# Patient Record
Sex: Female | Born: 1965 | Race: White | Hispanic: No | Marital: Married | Smoking: Never smoker
Health system: Southern US, Community
[De-identification: ages and names within clinical notes are randomized; demographics above are authoritative.]

## PROBLEM LIST (undated history)

## (undated) DIAGNOSIS — M25552 Pain in left hip: Secondary | ICD-10-CM

## (undated) DIAGNOSIS — R030 Elevated blood-pressure reading, without diagnosis of hypertension: Secondary | ICD-10-CM

## (undated) DIAGNOSIS — R748 Abnormal levels of other serum enzymes: Secondary | ICD-10-CM

## (undated) DIAGNOSIS — Z780 Asymptomatic menopausal state: Secondary | ICD-10-CM

## (undated) DIAGNOSIS — E7800 Pure hypercholesterolemia, unspecified: Secondary | ICD-10-CM

## (undated) DIAGNOSIS — F419 Anxiety disorder, unspecified: Secondary | ICD-10-CM

## (undated) HISTORY — DX: Abnormal levels of other serum enzymes: R74.8

## (undated) HISTORY — DX: Pain in left hip: M25.552

## (undated) HISTORY — DX: Asymptomatic menopausal state: Z78.0

## (undated) HISTORY — DX: Elevated blood-pressure reading, without diagnosis of hypertension: R03.0

## (undated) HISTORY — DX: Anxiety disorder, unspecified: F41.9

## (undated) HISTORY — DX: Pure hypercholesterolemia, unspecified: E78.00

---

## 1997-09-10 ENCOUNTER — Other Ambulatory Visit: Admission: RE | Admit: 1997-09-10 | Discharge: 1997-09-10 | Payer: Self-pay | Admitting: Obstetrics & Gynecology

## 1998-04-05 ENCOUNTER — Inpatient Hospital Stay (HOSPITAL_COMMUNITY): Admission: AD | Admit: 1998-04-05 | Discharge: 1998-04-07 | Payer: Self-pay | Admitting: Obstetrics and Gynecology

## 1998-04-05 ENCOUNTER — Inpatient Hospital Stay (HOSPITAL_COMMUNITY): Admission: AD | Admit: 1998-04-05 | Discharge: 1998-04-05 | Payer: Self-pay | Admitting: Obstetrics & Gynecology

## 1998-04-14 ENCOUNTER — Encounter (HOSPITAL_COMMUNITY): Admission: RE | Admit: 1998-04-14 | Discharge: 1998-07-13 | Payer: Self-pay | Admitting: Obstetrics & Gynecology

## 1998-05-21 ENCOUNTER — Other Ambulatory Visit: Admission: RE | Admit: 1998-05-21 | Discharge: 1998-05-21 | Payer: Self-pay | Admitting: Obstetrics & Gynecology

## 1999-07-02 ENCOUNTER — Other Ambulatory Visit: Admission: RE | Admit: 1999-07-02 | Discharge: 1999-07-02 | Payer: Self-pay | Admitting: Obstetrics & Gynecology

## 2000-10-04 ENCOUNTER — Other Ambulatory Visit: Admission: RE | Admit: 2000-10-04 | Discharge: 2000-10-04 | Payer: Self-pay | Admitting: Obstetrics & Gynecology

## 2001-10-20 ENCOUNTER — Other Ambulatory Visit: Admission: RE | Admit: 2001-10-20 | Discharge: 2001-10-20 | Payer: Self-pay | Admitting: Obstetrics and Gynecology

## 2002-01-25 HISTORY — PX: OTHER SURGICAL HISTORY: SHX169

## 2002-10-26 ENCOUNTER — Other Ambulatory Visit: Admission: RE | Admit: 2002-10-26 | Discharge: 2002-10-26 | Payer: Self-pay | Admitting: Obstetrics & Gynecology

## 2003-11-15 ENCOUNTER — Other Ambulatory Visit: Admission: RE | Admit: 2003-11-15 | Discharge: 2003-11-15 | Payer: Self-pay | Admitting: Obstetrics and Gynecology

## 2004-02-04 ENCOUNTER — Other Ambulatory Visit: Admission: RE | Admit: 2004-02-04 | Discharge: 2004-02-04 | Payer: Self-pay | Admitting: Obstetrics and Gynecology

## 2004-11-20 ENCOUNTER — Other Ambulatory Visit: Admission: RE | Admit: 2004-11-20 | Discharge: 2004-11-20 | Payer: Self-pay | Admitting: Obstetrics & Gynecology

## 2005-01-28 ENCOUNTER — Ambulatory Visit: Payer: Self-pay | Admitting: Family Medicine

## 2005-02-18 ENCOUNTER — Emergency Department (HOSPITAL_COMMUNITY): Admission: EM | Admit: 2005-02-18 | Discharge: 2005-02-18 | Payer: Self-pay | Admitting: Emergency Medicine

## 2005-03-11 ENCOUNTER — Ambulatory Visit (HOSPITAL_COMMUNITY): Admission: RE | Admit: 2005-03-11 | Discharge: 2005-03-11 | Payer: Self-pay | Admitting: Urology

## 2007-04-18 ENCOUNTER — Emergency Department (HOSPITAL_COMMUNITY): Admission: EM | Admit: 2007-04-18 | Discharge: 2007-04-18 | Payer: Self-pay | Admitting: Emergency Medicine

## 2007-04-24 ENCOUNTER — Encounter (INDEPENDENT_AMBULATORY_CARE_PROVIDER_SITE_OTHER): Payer: Self-pay | Admitting: Obstetrics & Gynecology

## 2007-04-24 ENCOUNTER — Ambulatory Visit (HOSPITAL_COMMUNITY): Admission: RE | Admit: 2007-04-24 | Discharge: 2007-04-25 | Payer: Self-pay | Admitting: Obstetrics & Gynecology

## 2010-06-09 NOTE — Op Note (Signed)
Danielle Heath, Danielle Heath              ACCOUNT NO.:  [id number]   MEDICAL RECORD NO.:  [id number]          PATIENT TYPE:  OIB   LOCATION:  9308                          FACILITY:  [facility]   PHYSICIAN:  Freddy Finner, M.D.   DATE OF BIRTH:  06-Mar-1965   DATE OF PROCEDURE:  04/24/2007  DATE OF DISCHARGE:                               OPERATIVE REPORT   PREOPERATIVE DIAGNOSES:  1. Uterine enlargement.  2. Menorrhagia.  3. Suspected uterine adenomyosis.   OPERATIVE PROCEDURES:  1. Laparoscopically-assisted vaginal hysterectomy.  2. Resection of Filshie clips from previous surgical sterilization.   ANESTHESIA:  General endotracheal.   ESTIMATED INTRAOPERATIVE BLOOD LOSS:  350 mL.   INTRAOPERATIVE COMPLICATIONS:  None.   DETAILS OF THE PRESENT ILLNESS:  These are recorded in the admission  note.   DESCRIPTION OF PROCEDURE:  The patient was admitted on the morning of  surgery.  She was brought to the operating room and placed under  adequate general endotracheal anesthesia.  She had received a bolus of  Ancef IV in the preoperative holding area, and was placed in PAS hose.  The abdomen, perineum and vagina were prepped in the usual fashion using  Betadine scrub, followed by Betadine solution.  The bladder was  evacuated with a Robison catheter.  A Hulka tenaculum was attached to  the cervix under direct visualization.  Sterile drapes were applied.  Two small incisions were made in the abdomen and one at the umbilicus.  Through an old scar, the other just above the symphysis in the midline.  An 11 mm bladed disposable trocar was entered easily into the umbilical  incision, while elevating the anterior abdominal wall manually.  Direct  inspection revealed adequate placement, and no evidence of injury on  entry.  Pneumoperitoneum was allowed to accumulate with carbon dioxide  gas.  A 5 mm trocar was placed through the lower incision, and through  it a blunt probe used for exposure and  traction during the procedure.  Scanning inspection of the upper abdomen revealed no apparent  abnormalities.  The appendix was visualized and photographed; it was  normal.  The pelvic contents were normal, except for enlargement of the  uterus; which appeared to be boggy.  There was a clip on the left  fallopian tube, that was included in the dissection.  There was a free  clip in the cul-de-sac which was grasped with the tripolar forceps and  remitted through the operating channel of the laparoscope.  Using the  Hulka tenaculum and blunt probe, adequate exposure and traction was  applied.  The Gyrus tripolar device was used through the operating  channel of the laparoscope.  To progressively develop the utero-ovarian  pedicles, the mesosalpinx, the round ligament and the upper broad  ligament on each side.  The dissection was carried down to the level  just above the uterine arteries.   Attention was turned vaginally.  A posterior weighted vaginal retractor  was placed.  The Hulka tenaculum was replaced with a Jacobs tenaculum.  Colpotomy incision was made, while attending to the mucosa posterior to  the  cervix.  The cervix was circumscribed with the scalpel to release  the mucosa.  The Bonanno posterior weighted retractor was then placed.  The LigaSure system was then used to seal and divide the uterosacral  pedicles, and then the bladder pillars on either side.  The bladder was  further advanced off the cervix.  The cardinal ligament and pedicles  were sealed and divided with LigaSure.  The anterior peritoneum was  entered.  The vessel pedicles were sealed and divided with LigaSure.  An  additional small pedicle above the vessels was required to completely  release the uterus, which was delivered through the vaginal introitus.  An arterial bleeding source at the level of the left uterosacral was  clamped with the Heaney, and suture ligated with 0 Monocryl.  The angles  of the vagina  were anchored to the uterosacrals on each side with a  mattress suture of 0 Monocryl.  The uterosacrals were plicated and the  posterior peritoneum closed with an interrupted 0 Monocryl suture.  The  cuff was closed vertically with figure-of-eight of 0 Monocryl.   A Foley catheter was placed.  After a change of gloves and repositioning  of the patient, a re-inspection laparoscopically was carried out.  Then  the Najat irrigation-aspiration system was used to clear small amounts  of clotted blood from the pelvic pedicles.  A couple of minor oozing  points were fulgurated with the tripolar forceps.   Hemostasis was confirmed under reduced intra-abdominal pressure and  irrigation.  The irrigating solution was aspirated from the abdomen.  Gas was allowed to escape.  Skin incisions were anesthetized with 0.25%  plain Marcaine.  The incisions were closed with interrupted subcuticular  sutures of 3-0 Dexon.  Steri-Strips were applied to the lower incision,  and then sterile dressing was to the upper incision.  The patient  tolerated the operative procedure well.  She was awakened and taken to  recovery in good condition.      Freddy Finner, M.D.  Electronically Signed     WRN/MEDQ  D:  04/24/2007  T:  04/24/2007  Job:  [id number]

## 2010-06-09 NOTE — H&P (Signed)
NAMEKIMIE, PIDCOCK NO.:  [id number]   MEDICAL RECORD NO.:  [id number]           PATIENT TYPE:   LOCATION:                                FACILITY:  [facility]   PHYSICIAN:  Freddy Finner, M.D.   DATE OF BIRTH:  [birth year]   DATE OF ADMISSION:  04/24/2007  DATE OF DISCHARGE:                              HISTORY & PHYSICAL   ADMISSION DIAGNOSIS:  Uterine enlargement with suspected uterine  adenomyosis, clinical symptoms of menorrhagia, previous surgical  sterilization.   HISTORY OF PRESENT ILLNESS:  The patient is a 45 year old white, married  female, gravida 1, para 1, who had tubal sterilization with Filshie  clips in 2003.  At that procedure, there were no pelvic abnormalities  noted except the overall size of the uterus had recently enlarged, but  it was symmetrical and, otherwise, normal in appearance.  The patient  has had progressively increasing menorrhagia and options of therapy were  discussed with her.  She had a sonohysterogram in the office with no  intracavitary abnormalities identified.  The myometrium was thickened to  2.1 cm on one wall and 2.4 on the other.  This finding was most  consistent, when coupled with clinical symptoms, of adenomyosis.  The  possibility of an intrauterine device, endometrial ablation, and  hysterectomy were discussed with the patient and she has elected the  later as the most appropriate for her situation.  She is admitted at  this time for laparoscopically assisted vaginal hysterectomy.   REVIEW OF SYMPTOMS:  Her current review of systems is remarkable for  back pain secondary to musculoskeletal strain for which she has been on  prednisone which will be completed approximately two days prior to  surgery.  She also is taking Flexeril at bedtime for this as well as  hydrocodone with acetaminophen.   PAST MEDICAL HISTORY:  The patient has no known significant medical  illnesses.  She does have situational anxiety and she is  currently  taking Lexapro 10 mg a day and Wellbutrin extended release XL 300 mg a  day.   ALLERGIES:  No known drug allergies.   PAST SURGICAL HISTORY:  Tubal sterilization noted above.  She gave birth  to one child vaginally.   FAMILY HISTORY:  Noncontributory.   PHYSICAL EXAMINATION:  HEENT:  Grossly within normal limits.  VITAL SIGNS:  Blood pressure in the office 120/70, weight 184, height 5  feet 6 1/4 inches.  NECK:  The thyroid gland is not palpably enlarged.  CHEST:  Clear to auscultation.  HEART:  Normal sinus rhythm without murmurs, gallops, and rubs.  ABDOMEN:  Soft, nontender, there is no appreciable organomegaly or  palpable masses.  BREASTS:  Recent breast exam in the office was normal with no palpable  masses, no nipple discharge, no skin change, or retraction.  PELVIC:  External genitalia, vagina, and cervix were normal to  inspection.  Bimanual reveals the uterus to be slightly enlarged and  mildly tender.  There are no palpable adnexal masses.  RECTAL:  Normal.  Rectovaginal exam confirms.  EXTREMITIES:  No cyanosis, clubbing, and  edema.   ASSESSMENT:  Clinical symptoms of menorrhagia, previous surgical  sterilization, myometrial thickening on sonohysterogram most consistent  with adenomyosis.   PLAN:  Laparoscopically assisted vaginal hysterectomy.  Bilateral  salpingo-oophorectomy will be performed only in the presence of  significant abnormality or obvious pathology of the ovaries.      Freddy Finner, M.D.  Electronically Signed     WRN/MEDQ  D:  04/20/2007  T:  04/20/2007  Job:  [id number]

## 2010-06-12 NOTE — Discharge Summary (Signed)
Danielle Heath, Danielle Heath              ACCOUNT NO.:  [id number]   MEDICAL RECORD NO.:  [id number]          PATIENT TYPE:  OIB   LOCATION:  9308                          FACILITY:  [facility]   PHYSICIAN:  Freddy Finner, M.D.   DATE OF BIRTH:  [birth year]   DATE OF ADMISSION:  04/24/2007  DATE OF DISCHARGE:  04/25/2007                               DISCHARGE SUMMARY   DISCHARGE DIAGNOSIS:  Uterine adenomyosis.   CLINICAL SYMPTOMS:  Menorrhagia.   OPERATIVE PROCEDURE:  Laparoscopic assisted vaginal hysterectomy,  removal of Filshie clips from previous surgical sterilization.   INTRAOPERATIVE AND POSTOPERATIVE COMPLICATIONS:  None.   DISPOSITION:  The patient was in satisfactory and improved condition at  the time of her discharge on the first postoperative day.  She was using  all of her previous medications from home.  She was given Percocet 5/325  to be taken as needed for pain.  She was given Zofran oral dissolving  tablets 4 mg to be used as needed for nausea and vomiting.  She was  instructed on regular diet.  She is to ambulate.  She is to avoid heavy  lifting or vaginal entry.  She is to report fever or heavy bleeding.  She is to return to the office in approximately 10-14 days for her first  postoperative visit.   LABORATORY DATA:  During this admission includes histologic confirmation  of a diagnosis of adenomyosis.  Admission CBC, prothrombin, PTT, they  were all normal.  Chem segs on admission was normal except for potassium  slightly low at 3.3, hemoglobin postoperatively was 11.8.   HOSPITAL COURSE:  The patient was admitted on the morning of surgery.  She was treated perioperatively with IV antibiotic with PAS compression  hose.  The above described procedure was accomplished without difficulty  or intraoperative complications.  Her postoperative course was  uncomplicated.  She remained afebrile.  By the first postoperative day  she was ambulating without difficulty or  assistance.  She was tolerating  a regular diet.  She was having adequate bowel and bladder function.  She was discharged home with disposition as noted above.      Freddy Finner, M.D.  Electronically Signed     WRN/MEDQ  D:  05/29/2007  T:  05/30/2007  Job:  [id number]

## 2010-10-19 LAB — CBC
HCT: 33.9 — ABNORMAL LOW
HCT: 39
Hemoglobin: 11.8 — ABNORMAL LOW
MCHC: 34.9
MCV: 92
MCV: 92.3
Platelets: 314
Platelets: 318
RDW: 13.1
WBC: 7.2

## 2010-10-19 LAB — PROTIME-INR: Prothrombin Time: 12.2

## 2010-10-19 LAB — APTT: aPTT: 27

## 2010-10-19 LAB — BASIC METABOLIC PANEL
Chloride: 102
GFR calc Af Amer: >60

## 2015-11-10 LAB — HM PAP SMEAR: HM PAP: NEGATIVE

## 2015-11-14 LAB — HM MAMMOGRAPHY

## 2016-11-10 ENCOUNTER — Ambulatory Visit (INDEPENDENT_AMBULATORY_CARE_PROVIDER_SITE_OTHER): Payer: 59

## 2016-11-10 ENCOUNTER — Ambulatory Visit (INDEPENDENT_AMBULATORY_CARE_PROVIDER_SITE_OTHER): Payer: 59 | Admitting: Osteopathic Medicine

## 2016-11-10 ENCOUNTER — Encounter: Payer: Self-pay | Admitting: Osteopathic Medicine

## 2016-11-10 ENCOUNTER — Encounter (INDEPENDENT_AMBULATORY_CARE_PROVIDER_SITE_OTHER): Payer: Self-pay

## 2016-11-10 VITALS — BP 149/89 | HR 72 | Ht 66.0 in | Wt 176.0 lb

## 2016-11-10 DIAGNOSIS — N951 Menopausal and female climacteric states: Secondary | ICD-10-CM | POA: Diagnosis not present

## 2016-11-10 DIAGNOSIS — M25552 Pain in left hip: Secondary | ICD-10-CM | POA: Diagnosis not present

## 2016-11-10 DIAGNOSIS — M25551 Pain in right hip: Secondary | ICD-10-CM

## 2016-11-10 DIAGNOSIS — R232 Flushing: Secondary | ICD-10-CM

## 2016-11-10 DIAGNOSIS — R5383 Other fatigue: Secondary | ICD-10-CM

## 2016-11-10 DIAGNOSIS — R454 Irritability and anger: Secondary | ICD-10-CM | POA: Diagnosis not present

## 2016-11-10 DIAGNOSIS — F419 Anxiety disorder, unspecified: Secondary | ICD-10-CM | POA: Diagnosis not present

## 2016-11-10 DIAGNOSIS — Z8619 Personal history of other infectious and parasitic diseases: Secondary | ICD-10-CM

## 2016-11-10 MED ORDER — VENLAFAXINE HCL ER 37.5 MG PO CP24: 37.5000 mg | ORAL_CAPSULE | Freq: Every day | ORAL | 1 refills | Status: DC

## 2016-11-10 MED ORDER — MELOXICAM 15 MG PO TABS: 15.0000 mg | ORAL_TABLET | Freq: Every day | ORAL | 0 refills | Status: DC

## 2016-11-10 NOTE — Progress Notes (Signed)
HPI: Danielle Heath is a 51 y.o. female  who [facility], 11/10/16,  for chief complaint of:  Chief Complaint  Patient presents with  . Establish Care    CHECK UP AND HORMON LEVELS     Leg pain: fall about a year ago, pulled to ground while walking dog, L knee injury. Kne epain better, current pain in R hip down into the leg, fells like tooth-ache type pain. Pressing on hit This giongon for a year and getting worse. No particular motion makes worse  Hot flashes, moody, irritable. Hysterectomy 2004, endometriosis. Left the ovaries.   History of chronic shingles, she has been on suppressive therapy with valacyclovir 500 mg daily for some time.  History of anxiety: Has been on Wellbutrin 300 mg daily and reports occasional use of Xanax a few times per week.  Vit D 2000 daily B12 4000 daily    Past medical, surgical, social and family history reviewed: There are no active problems to display for this patient.  No past surgical history on file. Social History  Substance Use Topics  . Smoking status: Never Smoker  . Smokeless tobacco: Never Used  . Alcohol use Not on file   Family History  Problem Relation Age of Onset  . Alcohol abuse Mother   . Depression Mother   . Hyperlipidemia Mother   . Hypertension Mother   . Alcohol abuse Father   . Cancer Father   . Hyperlipidemia Father   . Hypertension Father   . Diabetes Maternal Grandmother   . Alcohol abuse Maternal Grandfather      Current medication list and allergy/intolerance information reviewed:   Current Outpatient Prescriptions  Medication Sig Dispense Refill  . ALPRAZolam (XANAX) 0.5 MG tablet TAKE 1 TABLET 3 TIMES A DAY AS NEEDED  0  . buPROPion (WELLBUTRIN XL) 300 MG 24 hr tablet Take 300 mg by mouth every morning.  1  . cholecalciferol (VITAMIN D) 1000 units tablet Take 1,000 Units by mouth daily.    . valACYclovir (VALTREX) 1000 MG tablet Take 500 mg by  mouth daily.  6   No current facility-administered medications for this visit.    No Known Allergies    Review of Systems:  Constitutional:  No  fever, no chills, No recent illness, No unintentional weight changes. No significant fatigue.   HEENT: No  headache, no vision change, no hearing change, No sore throat, No  sinus pressure  Cardiac: No  chest pain, No  pressure, No palpitations, No  Orthopnea  Respiratory:  No  shortness of breath. No  Cough  Gastrointestinal: No  abdominal pain, No  nausea, No  vomiting,  No  blood in stool, No  diarrhea, No  constipation   Musculoskeletal: No new myalgia/arthralgia  Genitourinary: No  incontinence, No  abnormal genital bleeding, No abnormal genital discharge  Skin: No  Rash,  Hem/Onc: No  easy bruising/bleeding  Endocrine: No cold intolerance,  No heat intolerance.   Neurologic: No  weakness, No  dizziness,  Psychiatric: No  concerns with depression, +concerns with anxiety, +sleep problems, No mood problems  Exam:  BP (!) 149/89   Pulse 72   Ht 5\' 6"  (1.676 m)   Wt 176 lb (79.8 kg)   BMI 28.41 kg/m   Constitutional: VS see above. General Appearance: alert, well-developed, well-nourished, NAD  Eyes: Normal lids and conjunctive, non-icteric sclera  Ears, Nose, Mouth, Throat: MMM, Normal external inspection ears/nares/mouth/lips/gums.   Neck:  No masses, trachea midline. No thyroid enlargement.   Respiratory: Normal respiratory effort. no wheeze, no rhonchi, no rales  Cardiovascular: S1/S2 normal, no murmur, no rub/gallop auscultated. RRR. No lower extremity edema. Pedal pulse II/IV bilaterally DP and PT. No carotid bruit or JVD. No abdominal aortic bruit.  Gastrointestinal: Nontender, no masses. No hepatomegaly, no splenomegaly.   Musculoskeletal: Gait normal. No clubbing/cyanosis of digits.   Neurological: Normal balance/coordination. No tremor.   Skin: warm, dry, intact.   Psychiatric: Normal judgment/insight.  Normal mood and affect. Oriented x3.     ASSESSMENT/PLAN:   Fatigue, unspecified type - Plan: CBC, COMPLETE METABOLIC PANEL WITH GFR, Vitamin B12, VITAMIN D 25 Hydroxy (Vit-D Deficiency, Fractures), Lipid panel, TSH  Perimenopausal - Plan: VITAMIN D 25 Hydroxy (Vit-D Deficiency, Fractures), FSH/LH  Hot flashes - Plan: TSH, FSH/LH  Anxiety  Irritable  History of shingles  Left hip pain - Plan: meloxicam (MOBIC) 15 MG tablet, DG HIP UNILAT WITH PELVIS 2-3 VIEWS RIGHT, CANCELED: DG HIP UNILAT W OR W/O PELVIS 2-3 VIEWS LEFT    Patient Instructions  Plan:  Labs - checking hormone and vitamin levels as well as routine other screening  Hip - home exercises and anti-inflammatories. If no better, in 2-4 weeks, schedule follow up with Dr Denyse Amass or Dr T  Medications - antiinflammatory (mobic) and anti-depressant/anxiety/hot flash medication (Effexor)    Results for orders placed or performed in visit on 11/10/16 (from the past 72 hour(s))  CBC     Status: None   Collection Time: 11/10/16  4:20 PM  Result Value Ref Range   WBC 7.5 3.8 - 10.8 Thousand/uL   RBC 4.57 3.80 - 5.10 Million/uL   Hemoglobin 14.6 11.7 - 15.5 g/dL   HCT 78.2 95.6 - 21.3 %   MCV 89.9 80.0 - 100.0 fL   MCH 31.9 27.0 - 33.0 pg   MCHC 35.5 32.0 - 36.0 g/dL   RDW 08.6 57.8 - 46.9 %   Platelets 286 140 - 400 Thousand/uL   MPV 11.8 7.5 - 12.5 fL  COMPLETE METABOLIC PANEL WITH GFR     Status: Abnormal   Collection Time: 11/10/16  4:20 PM  Result Value Ref Range   Glucose, Bld 84 65 - 139 mg/dL    Comment: .        Non-fasting reference interval .    BUN 23 7 - 25 mg/dL   Creat 6.29 5.28 - 4.13 mg/dL   GFR, Est Non African American 97 > OR = 60 mL/min/1.30m2   GFR, Est African American 112 > OR = 60 mL/min/1.23m2   BUN/Creatinine Ratio NOT APPLICABLE 6 - 22 (calc)   Sodium 139 135 - 146 mmol/L   Potassium 4.4 3.5 - 5.3 mmol/L   Chloride 102 98 - 110 mmol/L   CO2 29 20 - 32 mmol/L   Calcium 10.1 8.6 -  10.2 mg/dL   Total Protein 6.9 6.1 - 8.1 g/dL   Albumin 4.7 3.6 - 5.1 g/dL   Globulin 2.2 1.9 - 3.7 g/dL (calc)   AG Ratio 2.1 1.0 - 2.5 (calc)   Total Bilirubin 0.5 0.2 - 1.2 mg/dL   Alkaline phosphatase (APISO) 101 33 - 115 U/L   AST 27 10 - 35 U/L   ALT 31 (H) 6 - 29 U/L  Vitamin B12     Status: Abnormal   Collection Time: 11/10/16  4:20 PM  Result Value Ref Range   Vitamin B-12 >2,000 (H) 200 - 1,100 pg/mL  VITAMIN D  25 Hydroxy (Vit-D Deficiency, Fractures)     Status: None   Collection Time: 11/10/16  4:20 PM  Result Value Ref Range   Vit D, 25-Hydroxy 73 30 - 100 ng/mL    Comment: Vitamin D Status         25-OH Vitamin D: . Deficiency:                    <20 ng/mL Insufficiency:             20 - 29 ng/mL Optimal:                 > or = 30 ng/mL . For 25-OH Vitamin D testing on patients on  D2-supplementation and patients for whom quantitation  of D2 and D3 fractions is required, the QuestAssureD(TM) 25-OH VIT D, (D2,D3), LC/MS/MS is recommended: order  code 16109 (patients >18yrs). . For more information on this test, go to: http://education.questdiagnostics.com/faq/FAQ163 (This link is being provided for  informational/educational purposes only.)   Lipid panel     Status: Abnormal   Collection Time: 11/10/16  4:20 PM  Result Value Ref Range   Cholesterol 253 (H) <200 mg/dL   HDL 58 >60 mg/dL   Triglycerides 454 (H) <150 mg/dL   LDL Cholesterol (Calc) 162 (H) mg/dL (calc)    Comment: Reference range: <100 . Desirable range <100 mg/dL for primary prevention;   <70 mg/dL for patients with CHD or diabetic patients  with > or = 2 CHD risk factors. Marland Kitchen LDL-C is now calculated using the Martin-Hopkins  calculation, which is a validated novel method providing  better accuracy than the Friedewald equation in the  estimation of LDL-C.  Horald Pollen et al. Lenox Ahr. 0981;191(47): 2061-2068  (http://education.[facility].com/faq/FAQ164)    Total CHOL/HDL Ratio 4.4 <5.0  (calc)   Non-HDL Cholesterol (Calc) 195 (H) <130 mg/dL (calc)    Comment: For patients with diabetes plus 1 major ASCVD risk  factor, treating to a non-HDL-C goal of <100 mg/dL  (LDL-C of <82 mg/dL) is considered a therapeutic  option.   TSH     Status: None   Collection Time: 11/10/16  4:20 PM  Result Value Ref Range   TSH 0.77 mIU/L    Comment:           Reference Range .           > or = 20 Years  0.40-4.50 .                Pregnancy Ranges           First trimester    0.26-2.66           Second trimester   0.55-2.73           Third trimester    0.43-2.91   FSH/LH     Status: None   Collection Time: 11/10/16  4:20 PM  Result Value Ref Range   FSH 93.1 mIU/mL    Comment:                     Reference Range .              Follicular Phase       2.5-10.2              Mid-cycle Peak         3.1-17.7              Luteal Phase  1.5- 9.1              Postmenopausal       23.0-116.3              .    LH 35.3 mIU/mL    Comment:     Reference Range Follicular Phase  1.9-12.5 Mid-Cycle Peak    8.7-76.3 Luteal Phase      0.5-16.9 Postmenopausal    10.0-54.7      Visit summary with medication list and pertinent instructions was printed for patient to review. All questions at time of visit were answered - patient instructed to contact office with any additional concerns. ER/RTC precautions were reviewed with the patient. Follow-up plan: Return in about 4 weeks (around 12/08/2016) for follow up on new meds, review labs, recheck BP .

## 2016-11-10 NOTE — Patient Instructions (Signed)
Plan:  Labs - checking hormone and vitamin levels as well as routine other screening  Hip - home exercises and anti-inflammatories. If no better, in 2-4 weeks, schedule follow up with Dr Denyse Amassorey or Dr T  Medications - antiinflammatory (mobic) and anti-depressant/anxiety/hot flash medication (Effexor)

## 2016-11-11 LAB — LIPID PANEL
CHOL/HDL RATIO: 4.4 (calc) (ref <5.0)
Cholesterol: 253 mg/dL — ABNORMAL HIGH (ref <200)
HDL: 58 mg/dL (ref >50)
LDL Cholesterol (Calc): 162 mg/dL (calc) — ABNORMAL HIGH
NON-HDL CHOLESTEROL (CALC): 195 mg/dL — AB (ref <130)
TRIGLYCERIDES: 173 mg/dL — AB (ref <150)

## 2016-11-11 LAB — CBC
HEMATOCRIT: 41.1 % (ref 35.0–45.0)
Hemoglobin: 14.6 g/dL (ref 11.7–15.5)
MCH: 31.9 pg (ref 27.0–33.0)
MCHC: 35.5 g/dL (ref 32.0–36.0)
MCV: 89.9 fL (ref 80.0–100.0)
MPV: 11.8 fL (ref 7.5–12.5)
PLATELETS: 286 10*3/uL (ref 140–400)
RBC: 4.57 10*6/uL (ref 3.80–5.10)
RDW: 12.2 % (ref 11.0–15.0)
WBC: 7.5 10*3/uL (ref 3.8–10.8)

## 2016-11-11 LAB — COMPLETE METABOLIC PANEL WITH GFR
AG Ratio: 2.1 (calc) (ref 1.0–2.5)
ALT: 31 U/L — AB (ref 6–29)
AST: 27 U/L (ref 10–35)
Albumin: 4.7 g/dL (ref 3.6–5.1)
Alkaline phosphatase (APISO): 101 U/L (ref 33–115)
BUN: 23 mg/dL (ref 7–25)
CALCIUM: 10.1 mg/dL (ref 8.6–10.2)
CO2: 29 mmol/L (ref 20–32)
CREATININE: 0.73 mg/dL (ref 0.50–1.10)
Chloride: 102 mmol/L (ref 98–110)
GFR, EST NON AFRICAN AMERICAN: 97 mL/min/{1.73_m2} (ref >=60)
GFR, Est African American: 112 mL/min/{1.73_m2} (ref >=60)
GLUCOSE: 84 mg/dL (ref 65–139)
Globulin: 2.2 g/dL (calc) (ref 1.9–3.7)
Potassium: 4.4 mmol/L (ref 3.5–5.3)
Sodium: 139 mmol/L (ref 135–146)
Total Bilirubin: 0.5 mg/dL (ref 0.2–1.2)
Total Protein: 6.9 g/dL (ref 6.1–8.1)

## 2016-11-11 LAB — FSH/LH
FSH: 93.1 m[IU]/mL
LH: 35.3 m[IU]/mL

## 2016-11-11 LAB — VITAMIN B12: Vitamin B-12: >2000 pg/mL — ABNORMAL HIGH (ref 200–1100)

## 2016-11-11 LAB — VITAMIN D 25 HYDROXY (VIT D DEFICIENCY, FRACTURES): VIT D 25 HYDROXY: 73 ng/mL (ref 30–100)

## 2016-11-11 LAB — TSH: TSH: 0.77 mIU/L

## 2016-12-06 ENCOUNTER — Other Ambulatory Visit: Payer: Self-pay | Admitting: Osteopathic Medicine

## 2016-12-06 DIAGNOSIS — M25552 Pain in left hip: Secondary | ICD-10-CM

## 2016-12-08 ENCOUNTER — Encounter: Payer: Self-pay | Admitting: Osteopathic Medicine

## 2016-12-08 ENCOUNTER — Ambulatory Visit (INDEPENDENT_AMBULATORY_CARE_PROVIDER_SITE_OTHER): Payer: 59 | Admitting: Osteopathic Medicine

## 2016-12-08 VITALS — BP 143/90 | HR 95 | Ht 66.0 in | Wt 176.0 lb

## 2016-12-08 DIAGNOSIS — E7800 Pure hypercholesterolemia, unspecified: Secondary | ICD-10-CM | POA: Diagnosis not present

## 2016-12-08 DIAGNOSIS — F419 Anxiety disorder, unspecified: Secondary | ICD-10-CM | POA: Diagnosis not present

## 2016-12-08 DIAGNOSIS — M25552 Pain in left hip: Secondary | ICD-10-CM

## 2016-12-08 DIAGNOSIS — Z780 Asymptomatic menopausal state: Secondary | ICD-10-CM

## 2016-12-08 DIAGNOSIS — R748 Abnormal levels of other serum enzymes: Secondary | ICD-10-CM

## 2016-12-08 DIAGNOSIS — R030 Elevated blood-pressure reading, without diagnosis of hypertension: Secondary | ICD-10-CM

## 2016-12-08 MED ORDER — MELOXICAM 15 MG PO TABS: 7.5000 mg | ORAL_TABLET | Freq: Every day | ORAL | 1 refills | Status: DC

## 2016-12-08 NOTE — Progress Notes (Signed)
HPI: Danielle Heath is a 51 y.o. female  who presents to [facility] [place] today, 12/08/16,  for chief complaint of:  Chief Complaint  Patient presents with  . Follow-up    EFFEXOR DIDNT MAKE HER FEEL RIGHT SO SHE QUIT TAKING IT    Elevated blood pressure: Patient has been reluctant to start medications for this.  Leg pain: fall about a year ago, pulled to ground while walking dog, L knee injury and right hip pain have improved dramatically after home exercises and back.  Hot flashes, moody, irritable. Hysterectomy 2004, endometriosis. Left the ovaries. Recent lab results consistent with postmenopausal state. We tried Effexor for these symptoms, she says this medicine made her quite tired so she stopped taking it.  History of chronic shingles, she has been on suppressive therapy with valacyclovir 500 mg daily for some time.  History of anxiety: Has been on Wellbutrin 300 mg daily and reports occasional use of Xanax a few times per week.  B12 excess: Patient advised of lab results.    Past medical, surgical, social and family history reviewed: Patient Active Problem List   Diagnosis Date Noted  . Elevated vitamin B12 level 12/09/2016  . Left hip pain 12/09/2016  . Elevated blood pressure reading 12/09/2016  . High cholesterol 12/09/2016  . Anxiety 12/09/2016   Past Surgical History:  Procedure Laterality Date  . UTERUS REMOVED  2004   Social History   Tobacco Use  . Smoking status: Never Smoker  . Smokeless tobacco: Never Used  Substance Use Topics  . Alcohol use: Not on file   Family History  Problem Relation Age of Onset  . Alcohol abuse Mother   . Depression Mother   . Hyperlipidemia Mother   . Hypertension Mother   . Alcohol abuse Father   . Cancer Father   . Hyperlipidemia Father   . Hypertension Father   . Diabetes Maternal Grandmother   . Alcohol abuse Maternal Grandfather      Current medication list and  allergy/intolerance information reviewed:   Current Outpatient Medications  Medication Sig Dispense Refill  . ALPRAZolam (XANAX) 0.5 MG tablet TAKE 1 TABLET 3 TIMES A DAY AS NEEDED  0  . buPROPion (WELLBUTRIN XL) 300 MG 24 hr tablet Take 300 mg by mouth every morning.  1  . cholecalciferol (VITAMIN D) 1000 units tablet Take 1,000 Units by mouth daily.    . meloxicam (MOBIC) 15 MG tablet Take 0.5-1 tablets (7.5-15 mg total) daily by mouth. As needed for aches/pains 90 tablet 1  . valACYclovir (VALTREX) 1000 MG tablet Take 500 mg by mouth daily.  6   No current facility-administered medications for this visit.    No Known Allergies    Review of Systems:  Constitutional:  No  fever, no chills, No recent illness.   HEENT: No  headache, no vision change  Cardiac: No  chest pain  Respiratory:  No  shortness of breath.  Musculoskeletal: No new myalgia/arthralgia  Psychiatric: No  concerns with depression, +concerns with anxiety, +sleep problems, No mood problems  Exam:  BP (!) 143/90   Pulse 95   Ht 5\' 6"  (1.676 m)   Wt 176 lb (79.8 kg)   BMI 28.41 kg/m   Constitutional: VS see above. General Appearance: alert, well-developed, well-nourished, NAD  Eyes: Normal lids and conjunctive, non-icteric sclera  Ears, Nose, Mouth, Throat: MMM, Normal external inspection ears/nares/mouth/lips/gums.   Neck: No masses, trachea midline. No thyroid enlargement.   Respiratory:  Normal respiratory effort. no wheeze, no rhonchi, no rales  Cardiovascular: S1/S2 normal, no murmur, no rub/gallop auscultated. RRR. No lower extremity edema. Pedal pulse II/IV bilaterally DP and PT. No carotid bruit or JVD. No abdominal aortic bruit.  Gastrointestinal: Nontender, no masses. No hepatomegaly, no splenomegaly.   Musculoskeletal: Gait normal. No clubbing/cyanosis of digits.   Neurological: Normal balance/coordination. No tremor.   Skin: warm, dry, intact.   Psychiatric: Normal judgment/insight.  Normal mood and affect. Oriented x3.   Recent Results (from the past 2160 hour(s))  CBC     Status: None   Collection Time: 11/10/16  4:20 PM  Result Value Ref Range   WBC 7.5 3.8 - 10.8 Thousand/uL   RBC 4.57 3.80 - 5.10 Million/uL   Hemoglobin 14.6 11.7 - 15.5 g/dL   HCT 45.441.1 09.835.0 - 11.945.0 %   MCV 89.9 80.0 - 100.0 fL   MCH 31.9 27.0 - 33.0 pg   MCHC 35.5 32.0 - 36.0 g/dL   RDW 14.712.2 82.911.0 - 56.215.0 %   Platelets 286 140 - 400 Thousand/uL   MPV 11.8 7.5 - 12.5 fL  COMPLETE METABOLIC PANEL WITH GFR     Status: Abnormal   Collection Time: 11/10/16  4:20 PM  Result Value Ref Range   Glucose, Bld 84 65 - 139 mg/dL    Comment: .        Non-fasting reference interval .    BUN 23 7 - 25 mg/dL   Creat 1.300.73 8.650.50 - 7.841.10 mg/dL   GFR, Est Non African American 97 > OR = 60 mL/min/1.6173m2   GFR, Est African American 112 > OR = 60 mL/min/1.7473m2   BUN/Creatinine Ratio NOT APPLICABLE 6 - 22 (calc)   Sodium 139 135 - 146 mmol/L   Potassium 4.4 3.5 - 5.3 mmol/L   Chloride 102 98 - 110 mmol/L   CO2 29 20 - 32 mmol/L   Calcium 10.1 8.6 - 10.2 mg/dL   Total Protein 6.9 6.1 - 8.1 g/dL   Albumin 4.7 3.6 - 5.1 g/dL   Globulin 2.2 1.9 - 3.7 g/dL (calc)   AG Ratio 2.1 1.0 - 2.5 (calc)   Total Bilirubin 0.5 0.2 - 1.2 mg/dL   Alkaline phosphatase (APISO) 101 33 - 115 U/L   AST 27 10 - 35 U/L   ALT 31 (H) 6 - 29 U/L  Vitamin B12     Status: Abnormal   Collection Time: 11/10/16  4:20 PM  Result Value Ref Range   Vitamin B-12 >2,000 (H) 200 - 1,100 pg/mL  VITAMIN D 25 Hydroxy (Vit-D Deficiency, Fractures)     Status: None   Collection Time: 11/10/16  4:20 PM  Result Value Ref Range   Vit D, 25-Hydroxy 73 30 - 100 ng/mL    Comment: Vitamin D Status         25-OH Vitamin D: . Deficiency:                    <20 ng/mL Insufficiency:             20 - 29 ng/mL Optimal:                 > or = 30 ng/mL . For 25-OH Vitamin D testing on patients on  D2-supplementation and patients for whom quantitation  of  D2 and D3 fractions is required, the QuestAssureD(TM) 25-OH VIT D, (D2,D3), LC/MS/MS is recommended: order  code [id number] (patients >4741yrs). . For more information on this  test, go to: http://education.questdiagnostics.com/faq/FAQ163 (This link is being provided for  informational/educational purposes only.)   Lipid panel     Status: Abnormal   Collection Time: 11/10/16  4:20 PM  Result Value Ref Range   Cholesterol 253 (H) <200 mg/dL   HDL 58 >82 mg/dL   Triglycerides 956 (H) <150 mg/dL   LDL Cholesterol (Calc) 162 (H) mg/dL (calc)    Comment: Reference range: <100 . Desirable range <100 mg/dL for primary prevention;   <70 mg/dL for patients with CHD or diabetic patients  with > or = 2 CHD risk factors. Marland Kitchen LDL-C is now calculated using the Martin-Hopkins  calculation, which is a validated novel method providing  better accuracy than the Friedewald equation in the  estimation of LDL-C.  Horald Pollen et al. Lenox Ahr. 2130;865(78): 2061-2068  (http://education.[facility].com/faq/FAQ164)    Total CHOL/HDL Ratio 4.4 <5.0 (calc)   Non-HDL Cholesterol (Calc) 195 (H) <130 mg/dL (calc)    Comment: For patients with diabetes plus 1 major ASCVD risk  factor, treating to a non-HDL-C goal of <100 mg/dL  (LDL-C of <46 mg/dL) is considered a therapeutic  option.   TSH     Status: None   Collection Time: 11/10/16  4:20 PM  Result Value Ref Range   TSH 0.77 mIU/L    Comment:           Reference Range .           > or = 20 Years  0.40-4.50 .                Pregnancy Ranges           First trimester    0.26-2.66           Second trimester   0.55-2.73           Third trimester    0.43-2.91   FSH/LH     Status: None   Collection Time: 11/10/16  4:20 PM  Result Value Ref Range   FSH 93.1 mIU/mL    Comment:                     Reference Range .              Follicular Phase       2.5-10.2              Mid-cycle Peak         3.1-17.7              Luteal Phase           1.5- 9.1               Postmenopausal       23.0-116.3              .    LH 35.3 mIU/mL    Comment:     Reference Range Follicular Phase  1.9-12.5 Mid-Cycle Peak    8.7-76.3 Luteal Phase      0.5-16.9 Postmenopausal    10.0-54.7      ASSESSMENT/PLAN:   Elevated blood pressure reading - Discussed risks versus benefits of untreated hypertension/medications. Patient opts to follow-up on blood pressure, we'll consider purchasing own cuff  High cholesterol - Plan: Lipid panel  Elevated vitamin B12 level - Plan: Vitamin B12  Left hip pain - Plan: meloxicam (MOBIC) 15 MG tablet  Anxiety  Postmenopausal    Patient Instructions   Plan to return for nurse visit to verify  home blood pressure cuff. In the meantime, be keeping a record of your blood pressures at home and bring this with you to the visit with the nurse.   If your cuff is measuring within 5-10 points of ours AND your home numbers are less than 140/90 (ideally less than 130/80) then nothing else to do.   If your home blood pressure cuff is inaccurate or is accurate but measuring above goal, we will need to talk about medications.     Total time spent: 25 minutes, greater than 50% of the visit was to face counseling and coordinating care for the above diagnoses.   Visit summary with medication list and pertinent instructions was printed for patient to review. All questions at time of visit were answered - patient instructed to contact office with any additional concerns. ER/RTC precautions were reviewed with the patient. Follow-up plan: Return in about 2 weeks (around 12/22/2016) for nurse visit BP check, followup with Dr A depending on that . HPI:

## 2016-12-08 NOTE — Patient Instructions (Signed)
   Plan to return for nurse visit to verify home blood pressure cuff. In the meantime, be keeping a record of your blood pressures at home and bring this with you to the visit with the nurse.   If your cuff is measuring within 5-10 points of ours AND your home numbers are less than 140/90 (ideally less than 130/80) then nothing else to do.   If your home blood pressure cuff is inaccurate or is accurate but measuring above goal, we will need to talk about medications.

## 2016-12-09 ENCOUNTER — Encounter: Payer: Self-pay | Admitting: Osteopathic Medicine

## 2016-12-09 DIAGNOSIS — E7800 Pure hypercholesterolemia, unspecified: Secondary | ICD-10-CM

## 2016-12-09 DIAGNOSIS — R748 Abnormal levels of other serum enzymes: Secondary | ICD-10-CM

## 2016-12-09 DIAGNOSIS — F419 Anxiety disorder, unspecified: Secondary | ICD-10-CM

## 2016-12-09 DIAGNOSIS — R030 Elevated blood-pressure reading, without diagnosis of hypertension: Secondary | ICD-10-CM

## 2016-12-09 DIAGNOSIS — R7989 Other specified abnormal findings of blood chemistry: Secondary | ICD-10-CM | POA: Insufficient documentation

## 2016-12-09 DIAGNOSIS — M25552 Pain in left hip: Secondary | ICD-10-CM

## 2016-12-09 DIAGNOSIS — Z780 Asymptomatic menopausal state: Secondary | ICD-10-CM

## 2016-12-09 HISTORY — DX: Pain in left hip: M25.552

## 2016-12-09 HISTORY — DX: Asymptomatic menopausal state: Z78.0

## 2016-12-09 HISTORY — DX: Abnormal levels of other serum enzymes: R74.8

## 2016-12-09 HISTORY — DX: Elevated blood-pressure reading, without diagnosis of hypertension: R03.0

## 2016-12-09 HISTORY — DX: Pure hypercholesterolemia, unspecified: E78.00

## 2016-12-09 HISTORY — DX: Anxiety disorder, unspecified: F41.9

## 2016-12-22 ENCOUNTER — Ambulatory Visit (INDEPENDENT_AMBULATORY_CARE_PROVIDER_SITE_OTHER): Payer: 59 | Admitting: Osteopathic Medicine

## 2016-12-22 VITALS — BP 135/83 | HR 75

## 2016-12-22 DIAGNOSIS — R030 Elevated blood-pressure reading, without diagnosis of hypertension: Secondary | ICD-10-CM

## 2016-12-22 NOTE — Progress Notes (Signed)
Her blood pressure monitor is fairly accurate. Home numbers are borderline, not consistently less than 130/80, occasionally 140s over 90s. Patient would like to avoid medications if possible, she is advised to wait 5 minutes before checking her home blood pressures. Based on current guidelines, she would be considered hypertensive but in shared decision making we do not necessarily have to start blood pressure medications if she does not want to as long as she is aware of risks versus benefits. Will address further at follow-up depending on what her home numbers are looking like.

## 2016-12-22 NOTE — Progress Notes (Signed)
Pt came into clinic today for BP check. She did bring her home monitor. Pt reports she has been taking her BP at home, and brought a list of readings. They range from 135/83-147/93. Most readings are around 138/85 (will scan list into chart). Home cuff read 153/82 (76) and 135/89 (73) today in office. Went over readings with PCP. Per PCP, Pt to continue monitoring at home. If readings dont start to come down, they will have to discuss potential BP Rx's. Pt verbalized understanding.

## 2017-02-04 ENCOUNTER — Encounter: Payer: Self-pay | Admitting: Osteopathic Medicine

## 2017-03-22 ENCOUNTER — Other Ambulatory Visit: Payer: Self-pay | Admitting: Osteopathic Medicine

## 2017-03-22 ENCOUNTER — Telehealth: Payer: Self-pay

## 2017-03-22 NOTE — Telephone Encounter (Signed)
Pt left a vm msg requesting medication refills for xanax & wellbutrin xl to be sent to [facility] on file. Both rxs were written by historical provider.

## 2017-03-23 ENCOUNTER — Telehealth: Payer: Self-pay

## 2017-03-23 MED ORDER — BUPROPION HCL ER (XL) 300 MG PO TB24: 300.0000 mg | ORAL_TABLET | Freq: Every morning | ORAL | 3 refills | Status: DC

## 2017-03-23 MED ORDER — ALPRAZOLAM 0.5 MG PO TABS: 0.5000 mg | ORAL_TABLET | Freq: Two times a day (BID) | ORAL | 0 refills | Status: DC | PRN

## 2017-03-23 NOTE — Telephone Encounter (Signed)
Sent!

## 2017-03-23 NOTE — Telephone Encounter (Signed)
Pharmacy requesting medication refill for valacyclovir. Rx written by historical provider. Thanks.

## 2017-03-23 NOTE — Telephone Encounter (Signed)
Left VM with update.  

## 2017-03-24 MED ORDER — VALACYCLOVIR HCL 500 MG PO TABS: 500.0000 mg | ORAL_TABLET | Freq: Every day | ORAL | 3 refills | Status: DC

## 2017-03-24 NOTE — Telephone Encounter (Signed)
Refill sent to [facility].  

## 2017-03-25 NOTE — Telephone Encounter (Signed)
Left vm msg for patient regarding update on med refill. Call back information provided.

## 2017-05-18 ENCOUNTER — Other Ambulatory Visit: Payer: Self-pay | Admitting: Osteopathic Medicine

## 2017-05-19 NOTE — Telephone Encounter (Signed)
[facility] requesting med refill for alprazolam.  

## 2017-05-31 ENCOUNTER — Other Ambulatory Visit: Payer: Self-pay | Admitting: Osteopathic Medicine

## 2017-05-31 DIAGNOSIS — M25552 Pain in left hip: Secondary | ICD-10-CM

## 2017-06-26 ENCOUNTER — Other Ambulatory Visit: Payer: Self-pay | Admitting: Osteopathic Medicine

## 2017-07-12 ENCOUNTER — Other Ambulatory Visit: Payer: Self-pay | Admitting: Osteopathic Medicine

## 2017-11-25 ENCOUNTER — Encounter: Payer: Self-pay | Admitting: Osteopathic Medicine

## 2017-11-25 ENCOUNTER — Ambulatory Visit (INDEPENDENT_AMBULATORY_CARE_PROVIDER_SITE_OTHER): Payer: 59 | Admitting: Osteopathic Medicine

## 2017-11-25 VITALS — BP 143/82 | HR 75 | Temp 98.1°F | Wt 181.8 lb

## 2017-11-25 DIAGNOSIS — Z0000 Encounter for general adult medical examination without abnormal findings: Secondary | ICD-10-CM

## 2017-11-25 DIAGNOSIS — R748 Abnormal levels of other serum enzymes: Secondary | ICD-10-CM

## 2017-11-25 DIAGNOSIS — G8929 Other chronic pain: Secondary | ICD-10-CM

## 2017-11-25 DIAGNOSIS — M25561 Pain in right knee: Secondary | ICD-10-CM

## 2017-11-25 DIAGNOSIS — E7800 Pure hypercholesterolemia, unspecified: Secondary | ICD-10-CM | POA: Diagnosis not present

## 2017-11-25 DIAGNOSIS — I10 Essential (primary) hypertension: Secondary | ICD-10-CM

## 2017-11-25 DIAGNOSIS — Z23 Encounter for immunization: Secondary | ICD-10-CM | POA: Diagnosis not present

## 2017-11-25 DIAGNOSIS — F419 Anxiety disorder, unspecified: Secondary | ICD-10-CM

## 2017-11-25 DIAGNOSIS — E559 Vitamin D deficiency, unspecified: Secondary | ICD-10-CM

## 2017-11-25 MED ORDER — HYDROCHLOROTHIAZIDE 12.5 MG PO TABS: 12.5000 mg | ORAL_TABLET | Freq: Every day | ORAL | 1 refills | Status: DC

## 2017-11-25 MED ORDER — ALPRAZOLAM 0.5 MG PO TABS: ORAL_TABLET | ORAL | 0 refills | Status: DC

## 2017-11-25 MED ORDER — MELOXICAM 15 MG PO TABS: 7.5000 mg | ORAL_TABLET | Freq: Every day | ORAL | 1 refills | Status: DC

## 2017-11-25 NOTE — Progress Notes (Signed)
HPI: Danielle Heath is a 52 y.o. female who  has a past medical history of Anxiety (12/09/2016), Elevated blood pressure reading (12/09/2016), Elevated vitamin B12 level (12/09/2016), High cholesterol (12/09/2016), Left hip pain (12/09/2016), and Postmenopausal (12/09/2016).  she presents to [facility] today, 11/25/17,  for chief complaint of:  BP follow-up Knee pain   BP's at home 169/90-something at highest. Seems to stay a bit high. Borderline BP in the past but was reluctant to add medications.   BP Readings from Last 3 Encounters:  11/25/17 (!) 143/82  12/22/16 135/83  12/08/16 (!) 143/90  11/10/17 149/89   R knee pain -injury for 5 months ago, dog pulled her over and she landed on her knee.  ER visit at that time showed no concern for major pathology, she went to physical therapy and has been taking NSAIDs intimately but knee still bothers her.  She shows photographs of a few days after the injury, significant bruising/ecchymosis anterior and posterior leg  Would like right ear checked, has been itching a bit and feels like there is something in it, husband has made her paranoid about a spiders nest in her ear or something else awful!  Requests refill of alprazolam.    Past medical history, surgical history, and family history reviewed.  Current medication list and allergy/intolerance information reviewed.   (See remainder of HPI, ROS, Phys Exam below)    ASSESSMENT/PLAN:    Chronic pain of right knee - Plan: meloxicam (MOBIC) 15 MG tablet, DG Knee Complete 4 Views Right, MR Knee Right Wo Contrast  Essential hypertension - Plan: CBC, COMPLETE METABOLIC PANEL WITH GFR, Lipid panel, Vitamin B12, VITAMIN D 25 Hydroxy (Vit-D Deficiency, Fractures), TSH, hydrochlorothiazide (HYDRODIURIL) 12.5 MG tablet  High cholesterol - Plan: CBC, COMPLETE METABOLIC PANEL WITH GFR, Lipid panel, Vitamin B12, VITAMIN D 25 Hydroxy (Vit-D Deficiency,  Fractures), TSH  Anxiety - Plan: CBC, COMPLETE METABOLIC PANEL WITH GFR, Lipid panel, Vitamin B12, VITAMIN D 25 Hydroxy (Vit-D Deficiency, Fractures), TSH  Elevated vitamin B12 level - Plan: CBC, COMPLETE METABOLIC PANEL WITH GFR, Lipid panel, Vitamin B12, VITAMIN D 25 Hydroxy (Vit-D Deficiency, Fractures), TSH  Vitamin D deficiency - Plan: CBC, COMPLETE METABOLIC PANEL WITH GFR, Lipid panel, Vitamin B12, VITAMIN D 25 Hydroxy (Vit-D Deficiency, Fractures), TSH  Annual physical exam - labs Ordered for future visit - Plan: CBC, COMPLETE METABOLIC PANEL WITH GFR, Lipid panel, Vitamin B12, VITAMIN D 25 Hydroxy (Vit-D Deficiency, Fractures), TSH  Need for Tdap vaccination - Plan: Tdap vaccine greater than or equal to 7yo IM   Meds ordered this encounter  Medications  . hydrochlorothiazide (HYDRODIURIL) 12.5 MG tablet    Sig: Take 1 tablet (12.5 mg total) by mouth daily.    Dispense:  30 tablet    Refill:  1  . meloxicam (MOBIC) 15 MG tablet    Sig: Take 0.5-1 tablets (7.5-15 mg total) by mouth daily. As needed for pain    Dispense:  90 tablet    Refill:  1  . ALPRAZolam (XANAX) 0.5 MG tablet    Sig: TAKE 1 TAB BY MOUTH 2 TIMES DAILY AS NEEDED FOR ANXIETY. SPARING USE TO AVOID TOLERANCE/DEPENDENCE.    Dispense:  30 tablet    Refill:  0    Not to exceed 5 additional fills before 09/19/2017    Patient Instructions  Plan:  BP Start hydrochlorothiazide medication Labs today BP recheck in office when we follow-up on MRI   Knee MRI Meloxicam  as needed Continue physical therapy exercises   Ear Mineral oil drops few times per week No spiders!   Anxiety Will refill Xanax by this afternoon      Follow-up plan: Return in about 2 weeks (around 12/09/2017) for review MRI w/ Dr Lyn Hollingshead few days after the scan. Will recheck BP at that visit. .                           ############################################ ############################################ ############################################ ############################################    Outpatient Encounter Medications as of 11/25/2017  Medication Sig  . ALPRAZolam (XANAX) 0.5 MG tablet TAKE 1 TAB BY MOUTH 2 TIMES DAILY AS NEEDED FOR ANXIETY. SPARING USE TO AVOID TOLERANCE/DEPENDENCE.  Marland Kitchen buPROPion (WELLBUTRIN XL) 300 MG 24 hr tablet Take 1 tablet (300 mg total) by mouth every morning.  . cholecalciferol (VITAMIN D) 1000 units tablet Take 1,000 Units by mouth daily.  . meloxicam (MOBIC) 15 MG tablet TAKE 1/2 TO 1 TABLET EVERY DAY AS NEEDED ACHES/PAINS  . valACYclovir (VALTREX) 500 MG tablet Take 1 tablet (500 mg total) by mouth daily.   No facility-administered encounter medications on file as of 11/25/2017.    No Known Allergies    Review of Systems:  Constitutional: No recent illness  HEENT: No  headache, no vision change  Cardiac: No  chest pain, No  pressure, No palpitations  Respiratory:  No  shortness of breath. No  Cough  Gastrointestinal: No  abdominal pain, no change on bowel habits  Musculoskeletal: + myalgia/arthralgia  Skin: No  Rash  Hem/Onc: No  easy bruising/bleeding, No  abnormal lumps/bumps  Neurologic: No  weakness, No  Dizziness  Psychiatric: No  concerns with depression, No  concerns with anxiety  Exam:  BP (!) 143/82 (BP Location: Left Arm, Patient Position: Sitting, Cuff Size: Normal)   Pulse 75   Temp 98.1 F (36.7 C) (Oral)   Wt 181 lb 12.8 oz (82.5 kg)   BMI 29.34 kg/m   Constitutional: VS see above. General Appearance: alert, well-developed, well-nourished, NAD  Eyes: Normal lids and conjunctive, non-icteric sclera  Ears, Nose, Mouth, Throat: MMM, Normal external inspection ears/nares/mouth/lips/gums.  Neck: No masses, trachea midline.   Respiratory: Normal respiratory effort. no wheeze,  no rhonchi, no rales  Cardiovascular: S1/S2 normal, no murmur, no rub/gallop auscultated. RRR.   Musculoskeletal: Gait normal. Symmetric and independent movement of all extremities  Significant tenderness, questionable laxity on posterior drawer test of right knee.  No obvious effusion, no ecchymoses.  Neurological: Normal balance/coordination. No tremor.  Skin: warm, dry, intact.   Psychiatric: Normal judgment/insight. Normal mood and affect. Oriented x3.   Visit summary with medication list and pertinent instructions was printed for patient to review, advised to alert [place] if any changes needed. All questions at time of visit were answered - patient instructed to contact office with any additional concerns. ER/RTC precautions were reviewed with the patient and understanding verbalized.   Follow-up plan: Return in about 2 weeks (around 12/09/2017) for review MRI w/ Dr Lyn Hollingshead few days after the scan. Will recheck BP at that visit. Marland Kitchen     Please note: voice recognition software was used to produce this document, and typos may escape review. Please contact Dr. Lyn Hollingshead for any needed clarifications.

## 2017-11-25 NOTE — Patient Instructions (Addendum)
Plan:  BP Start hydrochlorothiazide medication Labs today BP recheck in office when we follow-up on MRI   Knee MRI Meloxicam as needed Continue physical therapy exercises   Ear Mineral oil drops few times per week No spiders!   Anxiety Will refill Xanax by this afternoon

## 2017-11-26 LAB — COMPLETE METABOLIC PANEL WITH GFR
AG RATIO: 2.2 (calc) (ref 1.0–2.5)
ALKALINE PHOSPHATASE (APISO): 88 U/L (ref 33–130)
ALT: 22 U/L (ref 6–29)
AST: 21 U/L (ref 10–35)
Albumin: 4.6 g/dL (ref 3.6–5.1)
BILIRUBIN TOTAL: 0.4 mg/dL (ref 0.2–1.2)
BUN: 14 mg/dL (ref 7–25)
CALCIUM: 9.7 mg/dL (ref 8.6–10.4)
CO2: 28 mmol/L (ref 20–32)
Chloride: 104 mmol/L (ref 98–110)
Creat: 0.88 mg/dL (ref 0.50–1.05)
GFR, Est African American: 89 mL/min/{1.73_m2} (ref >=60)
GFR, Est Non African American: 77 mL/min/{1.73_m2} (ref >=60)
GLOBULIN: 2.1 g/dL (ref 1.9–3.7)
Glucose, Bld: 79 mg/dL (ref 65–139)
POTASSIUM: 3.9 mmol/L (ref 3.5–5.3)
SODIUM: 141 mmol/L (ref 135–146)
Total Protein: 6.7 g/dL (ref 6.1–8.1)

## 2017-11-26 LAB — CBC
HEMATOCRIT: 41.9 % (ref 35.0–45.0)
Hemoglobin: 14.4 g/dL (ref 11.7–15.5)
MCH: 31.1 pg (ref 27.0–33.0)
MCHC: 34.4 g/dL (ref 32.0–36.0)
MCV: 90.5 fL (ref 80.0–100.0)
MPV: 11.3 fL (ref 7.5–12.5)
PLATELETS: 280 10*3/uL (ref 140–400)
RBC: 4.63 10*6/uL (ref 3.80–5.10)
RDW: 13 % (ref 11.0–15.0)
WBC: 5.4 10*3/uL (ref 3.8–10.8)

## 2017-11-26 LAB — LIPID PANEL
CHOLESTEROL: 236 mg/dL — AB (ref <200)
HDL: 54 mg/dL (ref >50)
LDL Cholesterol (Calc): 150 mg/dL (calc) — ABNORMAL HIGH
Non-HDL Cholesterol (Calc): 182 mg/dL (calc) — ABNORMAL HIGH (ref <130)
TRIGLYCERIDES: 187 mg/dL — AB (ref <150)
Total CHOL/HDL Ratio: 4.4 (calc) (ref <5.0)

## 2017-11-26 LAB — VITAMIN B12: Vitamin B-12: >2000 pg/mL — ABNORMAL HIGH (ref 200–1100)

## 2017-11-26 LAB — VITAMIN D 25 HYDROXY (VIT D DEFICIENCY, FRACTURES): Vit D, 25-Hydroxy: 62 ng/mL (ref 30–100)

## 2017-11-26 LAB — TSH: TSH: 0.94 mIU/L

## 2017-12-12 ENCOUNTER — Ambulatory Visit (INDEPENDENT_AMBULATORY_CARE_PROVIDER_SITE_OTHER): Payer: 59

## 2017-12-12 ENCOUNTER — Ambulatory Visit: Payer: 59 | Admitting: Osteopathic Medicine

## 2017-12-12 DIAGNOSIS — G8929 Other chronic pain: Secondary | ICD-10-CM

## 2017-12-12 DIAGNOSIS — M25561 Pain in right knee: Secondary | ICD-10-CM

## 2017-12-18 ENCOUNTER — Other Ambulatory Visit: Payer: Self-pay | Admitting: Osteopathic Medicine

## 2017-12-18 DIAGNOSIS — I10 Essential (primary) hypertension: Secondary | ICD-10-CM

## 2018-01-14 ENCOUNTER — Other Ambulatory Visit: Payer: Self-pay | Admitting: Osteopathic Medicine

## 2018-01-14 DIAGNOSIS — I10 Essential (primary) hypertension: Secondary | ICD-10-CM

## 2018-02-07 ENCOUNTER — Other Ambulatory Visit: Payer: Self-pay | Admitting: Osteopathic Medicine

## 2018-02-07 DIAGNOSIS — I10 Essential (primary) hypertension: Secondary | ICD-10-CM

## 2018-02-11 ENCOUNTER — Other Ambulatory Visit: Payer: Self-pay | Admitting: Osteopathic Medicine

## 2018-02-13 NOTE — Telephone Encounter (Signed)
Sent!

## 2018-03-14 ENCOUNTER — Other Ambulatory Visit: Payer: Self-pay | Admitting: Osteopathic Medicine

## 2018-03-14 DIAGNOSIS — I10 Essential (primary) hypertension: Secondary | ICD-10-CM

## 2018-04-01 ENCOUNTER — Other Ambulatory Visit: Payer: Self-pay | Admitting: Osteopathic Medicine

## 2018-04-08 ENCOUNTER — Other Ambulatory Visit: Payer: Self-pay | Admitting: Osteopathic Medicine

## 2018-04-09 ENCOUNTER — Other Ambulatory Visit: Payer: Self-pay | Admitting: Osteopathic Medicine

## 2018-04-09 DIAGNOSIS — I10 Essential (primary) hypertension: Secondary | ICD-10-CM

## 2018-04-19 ENCOUNTER — Other Ambulatory Visit: Payer: Self-pay | Admitting: Osteopathic Medicine

## 2018-04-19 DIAGNOSIS — I10 Essential (primary) hypertension: Secondary | ICD-10-CM

## 2018-04-19 NOTE — Telephone Encounter (Signed)
Gave a partial fill pt needs an follow up appt

## 2018-04-28 ENCOUNTER — Other Ambulatory Visit: Payer: Self-pay | Admitting: Osteopathic Medicine

## 2018-04-28 DIAGNOSIS — I10 Essential (primary) hypertension: Secondary | ICD-10-CM

## 2018-04-29 ENCOUNTER — Other Ambulatory Visit: Payer: Self-pay | Admitting: Osteopathic Medicine

## 2018-05-04 ENCOUNTER — Encounter: Payer: Self-pay | Admitting: Osteopathic Medicine

## 2018-05-04 ENCOUNTER — Telehealth (INDEPENDENT_AMBULATORY_CARE_PROVIDER_SITE_OTHER): Payer: 59 | Admitting: Osteopathic Medicine

## 2018-05-04 ENCOUNTER — Other Ambulatory Visit: Payer: Self-pay | Admitting: Osteopathic Medicine

## 2018-05-04 DIAGNOSIS — I10 Essential (primary) hypertension: Secondary | ICD-10-CM

## 2018-05-04 MED ORDER — HYDROCHLOROTHIAZIDE 25 MG PO TABS: 25.0000 mg | ORAL_TABLET | Freq: Every day | ORAL | 0 refills | Status: DC

## 2018-05-04 NOTE — Patient Instructions (Signed)
Plan:  Increase HCTZ dose from 12/5 mg to 25 mg  [facility]  Check BP few times per week next 1-2 weeks Send me a MyChart message w/ BP numbers Goal 130/80 or less  Lab visit only in about 4 weeks (06/03/2018), anytime, no fasting needed   Annual check-up around 11/2018, see me sooner if needed!

## 2018-05-04 NOTE — Progress Notes (Signed)
HPI: Danielle Heath is a 53 y.o. female who  has a past medical history of Anxiety (12/09/2016), Elevated blood pressure reading (12/09/2016), Elevated vitamin B12 level (12/09/2016), High cholesterol (12/09/2016), Left hip pain (12/09/2016), and Postmenopausal (12/09/2016).  [facility] via MyChart visit today, 05/04/18,  for chief complaint of:  BP follow-up  HTN: BP has been borderline, some stress lately but it's been higher in the past. Tolerating HCTZ ok, no side effects.   BP Readings from Last 3 Encounters:  05/04/18 140/87  11/25/17 (!) 143/82  12/22/16 135/83     At today's visit 05/04/18 ... PMH, PSH, FH reviewed and updated as needed.  Current medication list and allergy/intolerance hx reviewed and updated as needed. (See remainder of HPI, ROS, Phys Exam below)     ASSESSMENT/PLAN: The encounter diagnosis was Essential hypertension.   Orders Placed This Encounter  Procedures  . BASIC METABOLIC PANEL WITH GFR     Meds ordered this encounter  Medications  . hydrochlorothiazide (HYDRODIURIL) 25 MG tablet    Sig: Take 1 tablet (25 mg total) by mouth daily.    Dispense:  90 tablet    Refill:  0    Patient Instructions  Plan:  Increase HCTZ dose from 12/5 mg to 25 mg  [facility]  Check BP few times per week next 1-2 weeks Send me a MyChart message w/ BP numbers Goal 130/80 or less  Lab visit only in about 4 weeks (06/03/2018), anytime, no fasting needed   Annual check-up around 11/2018, see me sooner if needed!        Follow-up plan: Return for LAB VISIT ONLY in 4 weeks, ANNUAL W/ FASTING LABS  11/2018.                                                 ################################################# ################################################# ################################################# #################################################    Current Meds  Medication Sig  . ALPRAZolam (XANAX) 0.5 MG tablet TAKE 1 TAB BY MOUTH 2 TIMES DAILY AS NEEDED FOR ANXIETY. SPARING USE TO AVOID TOLERANCE/DEPENDENCE.  Marland Kitchen. buPROPion (WELLBUTRIN XL) 300 MG 24 hr tablet Take 1 tablet (300 mg total) by mouth daily.  . cholecalciferol (VITAMIN D) 1000 units tablet Take 1,000 Units by mouth daily.  . hydrochlorothiazide (HYDRODIURIL) 25 MG tablet Take 1 tablet (25 mg total) by mouth daily.  . valACYclovir (VALTREX) 500 MG tablet TAKE 1 TABLET BY MOUTH EVERY DAY  . [DISCONTINUED] hydrochlorothiazide (HYDRODIURIL) 12.5 MG tablet TAKE 1 TABLET BY MOUTH EVERY DAY. OVERDUE FOR BP CHECK, NEEDS APPOINTMENT    Allergies  Allergen Reactions  . Ketorolac Tromethamine Nausea And Vomiting       Review of Systems:  Constitutional: No recent illness  Cardiac: No  chest pain, No  pressure, No palpitations  Respiratory:  No  shortness of breath.   Musculoskeletal: No new myalgia/arthralgia  Psychiatric: No  concerns with depression, concerns with anxiety  GAD 7 : Generalized Anxiety Score 05/04/2018 11/25/2017  Nervous, Anxious, on Edge 2 3  Control/stop worrying 3 3  Worry too much - different things 3 3  Trouble relaxing 3 3  Restless 2 2  Easily annoyed or irritable 2 3  Afraid - awful might happen 1 3  Total GAD 7 Score 16 20  Anxiety Difficulty Not  difficult at all Very difficult    Depression screen [facility] 2/9 05/04/2018 11/25/2017 11/10/2016  Decreased Interest 0 0 1  Down, Depressed, Hopeless 0 0 2  PHQ - 2 Score 0 0 3  Altered sleeping - 0 3  Tired, decreased energy - 2 3  Change in appetite - 2 3  Feeling bad or failure about  yourself  - 0 -  Trouble concentrating - 0 2  Moving slowly or fidgety/restless - 0 -  Suicidal thoughts - 0 -  PHQ-9 Score - 4 14  Difficult doing work/chores - Not difficult at all -     Exam:  BP 140/87   Temp 98.1 F (36.7 C) (Oral)   Wt 182 lb (82.6 kg)   BMI 29.38 kg/m   BP Readings from Last 3 Encounters:  05/04/18 140/87  11/25/17 (!) 143/82  12/22/16 135/83    Constitutional: VS see above. General Appearance: alert, well-developed, well-nourished, NAD  Neck: No masses, trachea midline.   Respiratory: Normal respiratory effort.   Psychiatric: Normal judgment/insight. Normal mood and affect. Oriented x3.       Visit summary with medication list and pertinent instructions was printed for patient to review, patient was advised to alert [place] if any updates are needed. All questions at time of visit were answered - patient instructed to contact office with any additional concerns. ER/RTC precautions were reviewed with the patient and understanding verbalized.      Please note: voice recognition software was used to produce this document, and typos may escape review. Please contact Dr. Lyn Hollingshead for any needed clarifications.    Follow up plan: Return for LAB VISIT ONLY in 4 weeks, ANNUAL W/ FASTING LABS 11/2018.

## 2018-05-13 ENCOUNTER — Encounter: Payer: Self-pay | Admitting: Osteopathic Medicine

## 2018-05-23 ENCOUNTER — Other Ambulatory Visit: Payer: Self-pay | Admitting: Osteopathic Medicine

## 2018-05-25 ENCOUNTER — Other Ambulatory Visit: Payer: Self-pay | Admitting: Osteopathic Medicine

## 2018-05-28 ENCOUNTER — Other Ambulatory Visit: Payer: Self-pay | Admitting: Osteopathic Medicine

## 2018-05-28 NOTE — Telephone Encounter (Signed)
Please review

## 2018-05-30 ENCOUNTER — Telehealth: Payer: Self-pay

## 2018-05-30 MED ORDER — ALPRAZOLAM 0.5 MG PO TABS: 0.5000 mg | ORAL_TABLET | Freq: Two times a day (BID) | ORAL | 0 refills | Status: DC | PRN

## 2018-05-30 NOTE — Telephone Encounter (Signed)
Sent!

## 2018-05-30 NOTE — Telephone Encounter (Signed)
Pt is requesting med refill for xanax. [facility]

## 2018-05-31 LAB — BASIC METABOLIC PANEL WITH GFR
BUN: 18 mg/dL (ref 7–25)
CO2: 30 mmol/L (ref 20–32)
Calcium: 9.7 mg/dL (ref 8.6–10.4)
Chloride: 104 mmol/L (ref 98–110)
Creat: 0.87 mg/dL (ref 0.50–1.05)
GFR, Est African American: 89 mL/min/{1.73_m2} (ref >=60)
GFR, Est Non African American: 77 mL/min/{1.73_m2} (ref >=60)
Glucose, Bld: 85 mg/dL (ref 65–99)
Potassium: 4.2 mmol/L (ref 3.5–5.3)
Sodium: 143 mmol/L (ref 135–146)

## 2018-05-31 NOTE — Telephone Encounter (Signed)
Pt advised.

## 2018-06-16 ENCOUNTER — Other Ambulatory Visit: Payer: Self-pay | Admitting: Osteopathic Medicine

## 2018-06-23 ENCOUNTER — Other Ambulatory Visit: Payer: Self-pay | Admitting: Osteopathic Medicine

## 2018-07-18 ENCOUNTER — Other Ambulatory Visit: Payer: Self-pay | Admitting: Osteopathic Medicine

## 2018-07-18 NOTE — Telephone Encounter (Signed)
Please advise 

## 2018-07-28 ENCOUNTER — Other Ambulatory Visit: Payer: Self-pay | Admitting: Osteopathic Medicine

## 2018-07-28 DIAGNOSIS — I10 Essential (primary) hypertension: Secondary | ICD-10-CM

## 2018-07-28 NOTE — Telephone Encounter (Signed)
Forwarding medication refill to PCP for review. 

## 2018-08-13 ENCOUNTER — Other Ambulatory Visit: Payer: Self-pay | Admitting: Osteopathic Medicine

## 2018-09-08 ENCOUNTER — Other Ambulatory Visit: Payer: Self-pay | Admitting: Osteopathic Medicine

## 2018-09-08 NOTE — Telephone Encounter (Signed)
Needs appointment

## 2018-10-05 ENCOUNTER — Other Ambulatory Visit: Payer: Self-pay | Admitting: Osteopathic Medicine

## 2018-10-05 NOTE — Telephone Encounter (Signed)
Requested medication (s) are due for refill today: yes  Requested medication (s) are on the active medication list: yes  Last refill:  09/08/2018  Future visit scheduled: yes  Notes to clinic: review for refill   Requested Prescriptions  Pending Prescriptions Disp Refills   valACYclovir (VALTREX) 500 MG tablet [Pharmacy Med Name: VALACYCLOVIR HCL 500 MG TABLET] 30 tablet 0    Sig: Take 1 tablet (500 mg total) by mouth daily. Needs appointment     Antimicrobials:  Antiviral Agents - Anti-Herpetic Passed - 10/05/2018 10:34 AM      Passed - Valid encounter within last 12 months    Recent Outpatient Visits          5 months ago Essential hypertension   [place] [facility], [place], DO   10 months ago Chronic pain of right knee   [facility], Lanelle Bal, DO   1 year ago Elevated blood pressure reading   [place], Lanelle Bal, DO   1 year ago Elevated blood pressure reading   [place], Lanelle Bal, DO   1 year ago Fatigue, unspecified type   [facility] [place], Lanelle Bal, DO      Future Appointments            In 2 months Emeterio Reeve, [place] [facility]

## 2018-10-27 ENCOUNTER — Other Ambulatory Visit: Payer: Self-pay | Admitting: Osteopathic Medicine

## 2018-10-27 DIAGNOSIS — I10 Essential (primary) hypertension: Secondary | ICD-10-CM

## 2018-10-27 NOTE — Telephone Encounter (Signed)
Requested medication (s) are due for refill today: yes  Requested medication (s) are on the active medication list: yes  Last refill:  05/30/2018  Future visit scheduled: yes  Notes to clinic:  Refill cannot be delegated    Requested Prescriptions  Pending Prescriptions Disp Refills   ALPRAZolam (XANAX) 0.5 MG tablet [Pharmacy Med Name: ALPRAZOLAM 0.5 MG TABLET] 30 tablet 0    Sig: Take 1 tablet (0.5 mg total) by mouth 2 (two) times daily as needed for anxiety.     Not Delegated - Psychiatry:  Anxiolytics/Hypnotics Failed - 10/27/2018  9:22 AM      Failed - This refill cannot be delegated      Failed - Urine Drug Screen completed in last 360 days.      Passed - Valid encounter within last 6 months    Recent Outpatient Visits          5 months ago Essential hypertension   [place] [facility], [place], DO   11 months ago Chronic pain of right knee   [facility], [place], DO   1 year ago Elevated blood pressure reading   [place], Lanelle Bal, DO   1 year ago Elevated blood pressure reading   [place], Lanelle Bal, DO   1 year ago Fatigue, unspecified type   [facility] [place], Lanelle Bal, DO      Future Appointments            In 1 month Emeterio Reeve, [place] [facility]

## 2018-10-27 NOTE — Telephone Encounter (Signed)
Requested medication (s) are due for refill today: yes  Requested medication (s) are on the active medication list:yes  Last refill:  10/05/2018  Future visit scheduled: yes  Notes to clinic:  Review for refill   Requested Prescriptions  Pending Prescriptions Disp Refills   valACYclovir (VALTREX) 500 MG tablet [Pharmacy Med Name: VALACYCLOVIR HCL 500 MG TABLET] 30 tablet 0    Sig: TAKE 1 TABLET (500 MG TOTAL) BY MOUTH DAILY. NEEDS APPOINTMENT     Antimicrobials:  Antiviral Agents - Anti-Herpetic Passed - 10/27/2018  9:18 AM      Passed - Valid encounter within last 12 months    Recent Outpatient Visits          5 months ago Essential hypertension   [place] [facility], [place], DO   11 months ago Chronic pain of right knee   [facility], Dorene Grebe, DO   1 year ago Elevated blood pressure reading   [facility], Dorene Grebe, DO   1 year ago Elevated blood pressure reading   [facility], Dorene Grebe, DO   1 year ago Fatigue, unspecified type   [place] [facility], Dorene Grebe, DO      Future Appointments            In 1 month Sunnie Nielsen, DO [facility]            hydrochlorothiazide (HYDRODIURIL) 12.5 MG tablet [Pharmacy Med Name: HYDROCHLOROTHIAZIDE 12.5 MG TB] 15 tablet 0    Sig: TAKE 1 TABLET BY MOUTH EVERY DAY. OVERDUE FOR BP CHECK, NEEDS APPOINTMENT     Cardiovascular: Diuretics - Thiazide Failed - 10/27/2018  9:18 AM      Failed - Last BP in normal range    BP Readings from Last 1 Encounters:  05/04/18 140/87         Passed - Ca in normal range and within 360 days    Calcium  Date Value Ref Range Status  05/30/2018 9.7 8.6 - 10.4 mg/dL Final         Passed - Cr in normal range and within 360 days    Creat  Date Value  Ref Range Status  05/30/2018 0.87 0.50 - 1.05 mg/dL Final    Comment:    For patients >5 years of age, the reference limit for Creatinine is approximately 13% higher for people identified as African-American. .          Passed - K in normal range and within 360 days    Potassium  Date Value Ref Range Status  05/30/2018 4.2 3.5 - 5.3 mmol/L Final         Passed - Na in normal range and within 360 days    Sodium  Date Value Ref Range Status  05/30/2018 143 135 - 146 mmol/L Final         Passed - Valid encounter within last 6 months    Recent Outpatient Visits          5 months ago Essential hypertension   [place] [facility], [place], DO   11 months ago Chronic pain of right knee   [facility], Dorene Grebe, DO   1 year ago Elevated blood pressure reading   [facility], [place],  DO   1 year ago Elevated blood pressure reading   [facility], Lanelle Bal, DO   1 year ago Fatigue, unspecified type   [facility], Lanelle Bal, DO      Future Appointments            In 1 month Emeterio Reeve, [place] [facility]

## 2018-10-27 NOTE — Telephone Encounter (Signed)
[facility]  

## 2018-11-15 ENCOUNTER — Other Ambulatory Visit: Payer: Self-pay

## 2018-11-15 ENCOUNTER — Telehealth: Payer: Self-pay | Admitting: Osteopathic Medicine

## 2018-11-15 DIAGNOSIS — R748 Abnormal levels of other serum enzymes: Secondary | ICD-10-CM

## 2018-11-15 DIAGNOSIS — E559 Vitamin D deficiency, unspecified: Secondary | ICD-10-CM

## 2018-11-15 DIAGNOSIS — E7800 Pure hypercholesterolemia, unspecified: Secondary | ICD-10-CM

## 2018-11-15 DIAGNOSIS — I10 Essential (primary) hypertension: Secondary | ICD-10-CM

## 2018-11-15 DIAGNOSIS — Z0000 Encounter for general adult medical examination without abnormal findings: Secondary | ICD-10-CM

## 2018-11-15 DIAGNOSIS — F419 Anxiety disorder, unspecified: Secondary | ICD-10-CM

## 2018-11-15 MED ORDER — HYDROCHLOROTHIAZIDE 12.5 MG PO TABS: ORAL_TABLET | ORAL | 0 refills | Status: DC

## 2018-11-15 MED ORDER — BUPROPION HCL ER (XL) 300 MG PO TB24: 300.0000 mg | ORAL_TABLET | Freq: Every day | ORAL | 0 refills | Status: DC

## 2018-11-15 NOTE — Telephone Encounter (Signed)
Needs labs put in and possibly med refills for physical resch in January

## 2018-11-15 NOTE — Telephone Encounter (Signed)
Task completed. Annual labs ordered (TSH/vit D/ vit B12/lipid panel/cmp w/gfr/cbc). Med refills for bupropion and HCTZ sent to local pharmacy. Pls inform pt labs to be completed after 11/29/2018. Thanks.

## 2018-11-15 NOTE — Progress Notes (Signed)
Pt requested lab order. Annual labs ordered for pt to completed on 11/29/2018.

## 2018-11-20 ENCOUNTER — Other Ambulatory Visit: Payer: Self-pay | Admitting: Osteopathic Medicine

## 2018-11-20 NOTE — Telephone Encounter (Signed)
Requested medication (s) are due for refill today: yes  Requested medication (s) are on the active medication list: yes  Last refill:  10/27/2018  Future visit scheduled: yes  Notes to clinic:  Patient has appointment schedule in 2 months    Requested Prescriptions  Pending Prescriptions Disp Refills   valACYclovir (VALTREX) 500 MG tablet [Pharmacy Med Name: VALACYCLOVIR HCL 500 MG TABLET] 30 tablet 0    Sig: TAKE 1 TABLET (500 MG TOTAL) BY MOUTH DAILY. NEEDS APPOINTMENT     Antimicrobials:  Antiviral Agents - Anti-Herpetic Passed - 11/20/2018  9:31 AM      Passed - Valid encounter within last 12 months    Recent Outpatient Visits          6 months ago Essential hypertension   [place] [facility], [place], DO   12 months ago Chronic pain of right knee   [facility], Lanelle Bal, DO   1 year ago Elevated blood pressure reading   [place], Lanelle Bal, DO   1 year ago Elevated blood pressure reading   [place], Lanelle Bal, DO   2 years ago Fatigue, unspecified type   [facility] [place], Lanelle Bal, DO      Future Appointments            In 2 months Emeterio Reeve, [place] [facility]

## 2018-12-12 ENCOUNTER — Encounter: Payer: 59 | Admitting: Osteopathic Medicine

## 2018-12-13 ENCOUNTER — Other Ambulatory Visit: Payer: Self-pay | Admitting: Osteopathic Medicine

## 2019-01-05 ENCOUNTER — Other Ambulatory Visit: Payer: Self-pay | Admitting: Physician Assistant

## 2019-01-05 NOTE — Telephone Encounter (Signed)
Alexander patient - please review. Thanks!

## 2019-01-13 ENCOUNTER — Other Ambulatory Visit: Payer: Self-pay | Admitting: Osteopathic Medicine

## 2019-01-13 DIAGNOSIS — I10 Essential (primary) hypertension: Secondary | ICD-10-CM

## 2019-01-13 NOTE — Telephone Encounter (Signed)
Forwarding medication refill request to the clinical pool for review. 

## 2019-01-15 IMAGING — MR MR KNEE*R* W/O CM
7 series · 40 of 40 positions shown · non-contrast
Comparison: None.

CLINICAL DATA: Persistent knee pain with swelling and bruising
since falling 5 months ago. Limited response to physical therapy. No
previous relevant surgery.

EXAM:
MRI OF THE RIGHT KNEE WITHOUT CONTRAST
TECHNIQUE: Multiplanar, multisequence MR imaging of the knee was performed. No
intravenous contrast was administered.

[Series 3: T2 fat-sat · axial · 4.0mm · 0.53mm/px · z∈[-93,+77]mm · 6 of 35 slices shown (1 of 3)]
[im 1/35]
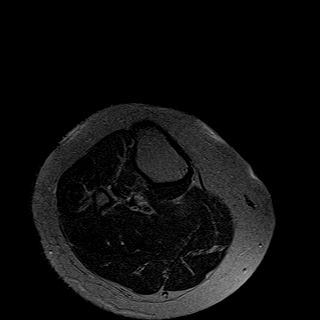
[im 7/35]
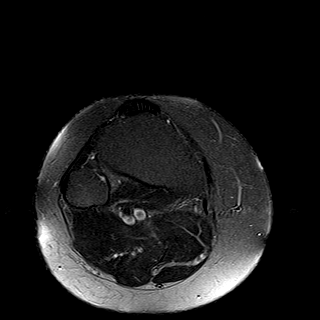
[im 14/35]
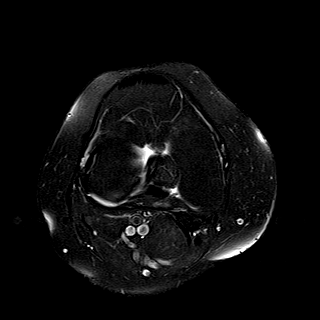
[im 21/35]
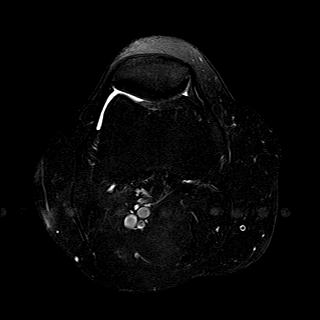
[im 28/35]
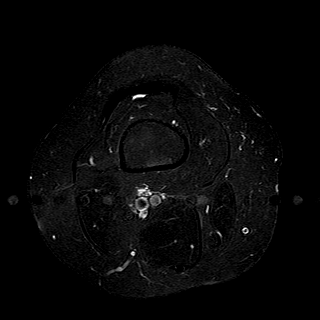
[im 35/35]
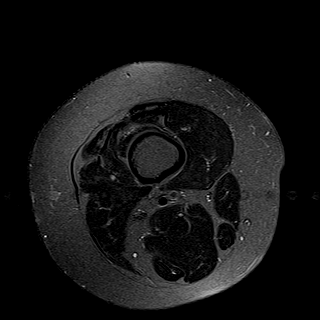

[Series 4: T1 · coronal · 4.0mm · 0.62mm/px · 6 of 31 slices shown]
[im 1/31]
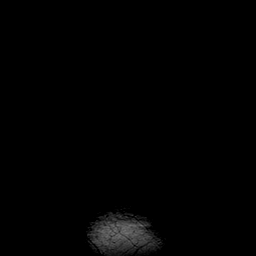
[im 7/31]
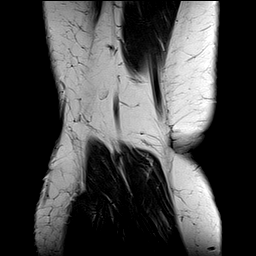
[im 13/31]
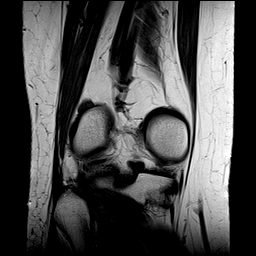
[im 19/31]
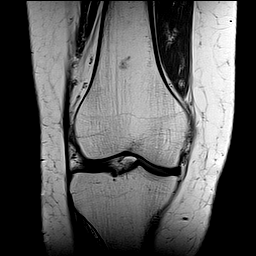
[im 25/31]
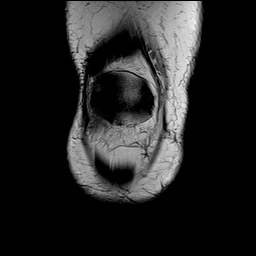
[im 31/31]
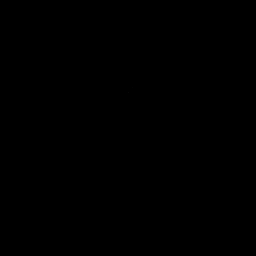

[Series 5: T2 fat-sat · coronal · 4.0mm · 0.62mm/px · 6 of 31 slices shown (2 of 3)]
[im 1/31]
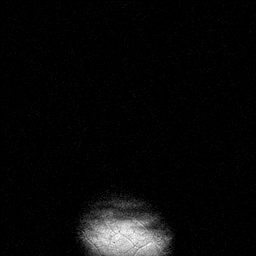
[im 7/31]
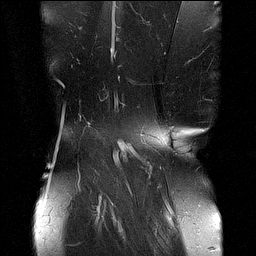
[im 13/31]
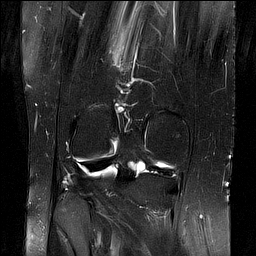
[im 19/31]
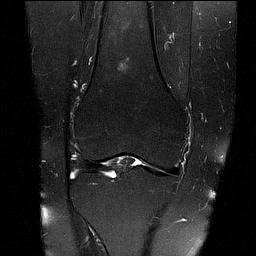
[im 25/31]
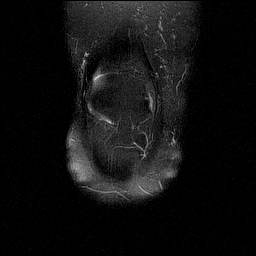
[im 31/31]
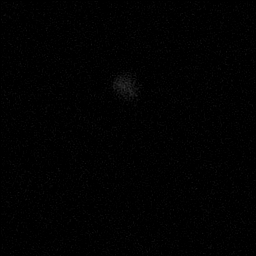

[Series 6: PD fat-sat · coronal · 4.0mm · 0.62mm/px · 6 of 31 slices shown (1 of 3)]
[im 1/31]
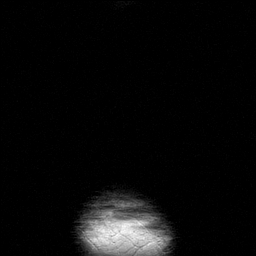
[im 7/31]
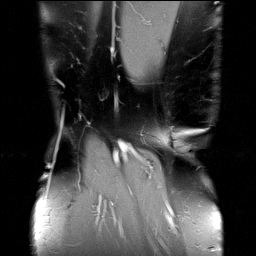
[im 13/31]
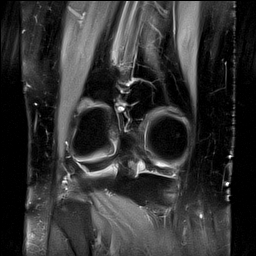
[im 19/31]
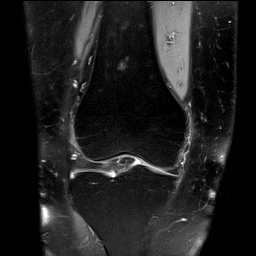
[im 25/31]
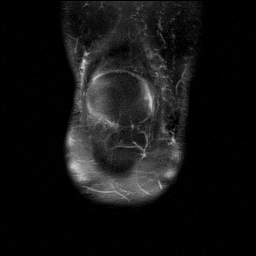
[im 31/31]
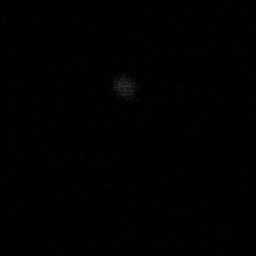

[Series 7: PD fat-sat · sagittal · 3.0mm · 0.62mm/px · 6 of 33 slices shown (2 of 3)]
[im 1/33]
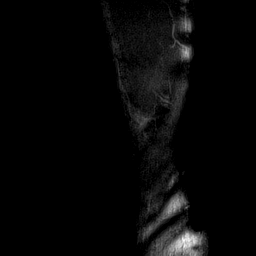
[im 7/33]
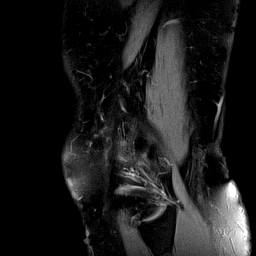
[im 13/33]
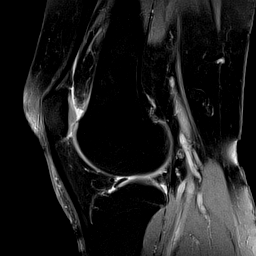
[im 20/33]
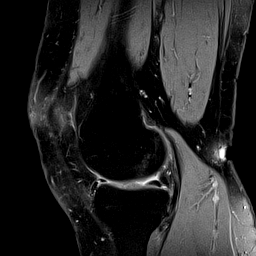
[im 26/33]
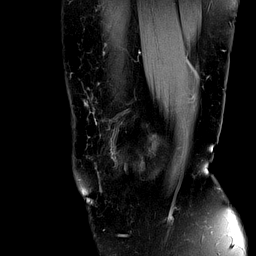
[im 33/33]
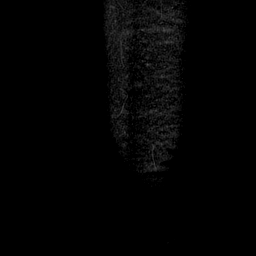

[Series 8: T2 fat-sat · sagittal · 3.0mm · 0.62mm/px · 6 of 33 slices shown (3 of 3)]
[im 1/33]
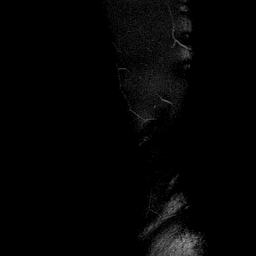
[im 7/33]
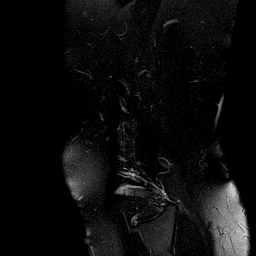
[im 13/33]
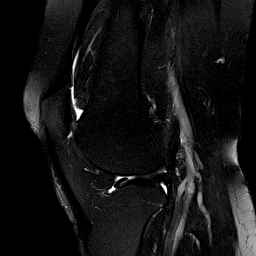
[im 20/33]
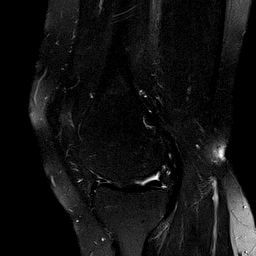
[im 26/33]
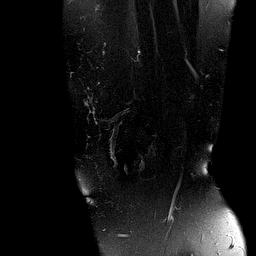
[im 33/33]
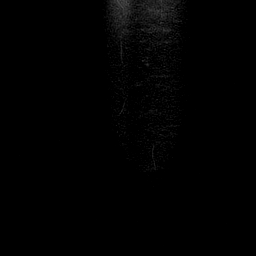

[Series 9: PD fat-sat · oblique · 2.0mm · 0.62mm/px · 4 of 19 slices shown (3 of 3)]
[im 1/19]
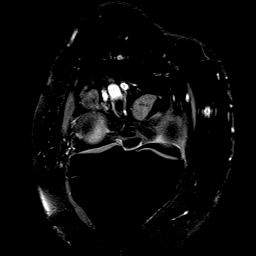
[im 7/19]
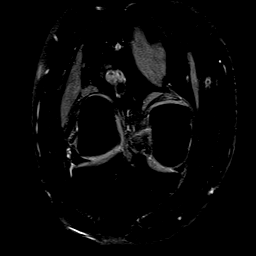
[im 13/19]
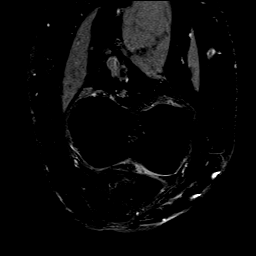
[im 19/19]
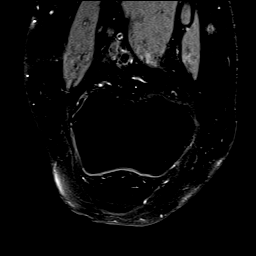

[40 of 40 positions shown; findings below may reference images not displayed]

FINDINGS: MENISCI

Medial meniscus:  Intact with normal morphology.

Lateral meniscus:  Intact with normal morphology.

LIGAMENTS

Cruciates:  Intact.

Collaterals:  Intact.

CARTILAGE

Patellofemoral:  Preserved.

Medial:  Preserved.

Lateral:  Preserved.

MISCELLANEOUS

Joint:  No significant joint effusion.

Popliteal Fossa:  Unremarkable. No significant Baker's cyst.

Extensor Mechanism:  Intact.

Bones:  No acute or significant extra-articular osseous findings.

Other: No significant periarticular soft tissue findings.
IMPRESSION: Normal examination.

## 2019-02-01 ENCOUNTER — Encounter: Payer: Self-pay | Admitting: Osteopathic Medicine

## 2019-02-02 ENCOUNTER — Other Ambulatory Visit: Payer: Self-pay | Admitting: Osteopathic Medicine

## 2019-02-04 ENCOUNTER — Other Ambulatory Visit: Payer: Self-pay | Admitting: Osteopathic Medicine

## 2019-02-08 ENCOUNTER — Other Ambulatory Visit: Payer: Self-pay

## 2019-02-08 DIAGNOSIS — E7800 Pure hypercholesterolemia, unspecified: Secondary | ICD-10-CM

## 2019-02-08 DIAGNOSIS — Z0000 Encounter for general adult medical examination without abnormal findings: Secondary | ICD-10-CM

## 2019-02-08 DIAGNOSIS — R748 Abnormal levels of other serum enzymes: Secondary | ICD-10-CM

## 2019-02-08 DIAGNOSIS — I10 Essential (primary) hypertension: Secondary | ICD-10-CM

## 2019-02-08 DIAGNOSIS — E559 Vitamin D deficiency, unspecified: Secondary | ICD-10-CM

## 2019-02-08 DIAGNOSIS — F419 Anxiety disorder, unspecified: Secondary | ICD-10-CM

## 2019-02-09 LAB — CBC
HCT: 43.3 % (ref 35.0–45.0)
Hemoglobin: 15 g/dL (ref 11.7–15.5)
MCH: 31.8 pg (ref 27.0–33.0)
MCHC: 34.6 g/dL (ref 32.0–36.0)
MCV: 91.9 fL (ref 80.0–100.0)
MPV: 10.7 fL (ref 7.5–12.5)
Platelets: 312 10*3/uL (ref 140–400)
RBC: 4.71 10*6/uL (ref 3.80–5.10)
RDW: 12.3 % (ref 11.0–15.0)
WBC: 4.8 10*3/uL (ref 3.8–10.8)

## 2019-02-09 LAB — LIPID PANEL
Cholesterol: 271 mg/dL — ABNORMAL HIGH (ref <200)
HDL: 58 mg/dL (ref >=50)
LDL Cholesterol (Calc): 181 mg/dL (calc) — ABNORMAL HIGH
Non-HDL Cholesterol (Calc): 213 mg/dL (calc) — ABNORMAL HIGH (ref <130)
Total CHOL/HDL Ratio: 4.7 (calc) (ref <5.0)
Triglycerides: 167 mg/dL — ABNORMAL HIGH (ref <150)

## 2019-02-09 LAB — COMPLETE METABOLIC PANEL WITH GFR
AG Ratio: 2 (calc) (ref 1.0–2.5)
ALT: 18 U/L (ref 6–29)
AST: 20 U/L (ref 10–35)
Albumin: 4.6 g/dL (ref 3.6–5.1)
Alkaline phosphatase (APISO): 75 U/L (ref 37–153)
BUN: 16 mg/dL (ref 7–25)
CO2: 29 mmol/L (ref 20–32)
Calcium: 10.2 mg/dL (ref 8.6–10.4)
Chloride: 102 mmol/L (ref 98–110)
Creat: 0.97 mg/dL (ref 0.50–1.05)
GFR, Est African American: 78 mL/min/{1.73_m2} (ref >=60)
GFR, Est Non African American: 67 mL/min/{1.73_m2} (ref >=60)
Globulin: 2.3 g/dL (calc) (ref 1.9–3.7)
Glucose, Bld: 92 mg/dL (ref 65–99)
Potassium: 4.2 mmol/L (ref 3.5–5.3)
Sodium: 142 mmol/L (ref 135–146)
Total Bilirubin: 0.6 mg/dL (ref 0.2–1.2)
Total Protein: 6.9 g/dL (ref 6.1–8.1)

## 2019-02-09 LAB — TSH: TSH: 1.36 mIU/L

## 2019-02-09 LAB — VITAMIN D 25 HYDROXY (VIT D DEFICIENCY, FRACTURES): Vit D, 25-Hydroxy: 38 ng/mL (ref 30–100)

## 2019-02-09 LAB — VITAMIN B12: Vitamin B-12: >2000 pg/mL — ABNORMAL HIGH (ref 200–1100)

## 2019-02-15 ENCOUNTER — Encounter: Payer: Self-pay | Admitting: Osteopathic Medicine

## 2019-02-15 ENCOUNTER — Ambulatory Visit (INDEPENDENT_AMBULATORY_CARE_PROVIDER_SITE_OTHER): Payer: 59 | Admitting: Osteopathic Medicine

## 2019-02-15 VITALS — BP 135/73 | HR 78 | Temp 97.9°F | Wt 174.0 lb

## 2019-02-15 DIAGNOSIS — Z0000 Encounter for general adult medical examination without abnormal findings: Secondary | ICD-10-CM | POA: Diagnosis not present

## 2019-02-15 DIAGNOSIS — I10 Essential (primary) hypertension: Secondary | ICD-10-CM | POA: Diagnosis not present

## 2019-02-15 DIAGNOSIS — F419 Anxiety disorder, unspecified: Secondary | ICD-10-CM | POA: Diagnosis not present

## 2019-02-15 DIAGNOSIS — E782 Mixed hyperlipidemia: Secondary | ICD-10-CM

## 2019-02-15 MED ORDER — ESCITALOPRAM OXALATE 5 MG PO TABS: 5.0000 mg | ORAL_TABLET | Freq: Every day | ORAL | 0 refills | Status: DC

## 2019-02-15 MED ORDER — BUPROPION HCL ER (XL) 300 MG PO TB24: 300.0000 mg | ORAL_TABLET | Freq: Every day | ORAL | 3 refills | Status: DC

## 2019-02-15 MED ORDER — HYDROCHLOROTHIAZIDE 25 MG PO TABS: 25.0000 mg | ORAL_TABLET | Freq: Every day | ORAL | 3 refills | Status: DC

## 2019-02-15 MED ORDER — VALACYCLOVIR HCL 500 MG PO TABS: 500.0000 mg | ORAL_TABLET | Freq: Every day | ORAL | 3 refills | Status: DC

## 2019-02-15 NOTE — Progress Notes (Signed)
Virtual Visit via Video (App used: Doximity) Note  I connected with      Danielle Heath on 02/15/19 at 1:22 PM  by a telemedicine application and verified that I am speaking with the correct person using two identifiers.  Patient is at home I am in office   I discussed the limitations of evaluation and management by telemedicine and the availability of in person appointments. The patient expressed understanding and agreed to proceed.  History of Present Illness: Danielle Heath is a 54 y.o. female who would like to discuss annual physical and increased anxiety     Patient here for annual physical / wellness exam.  See preventive care reviewed as below.  Recent labs reviewed in detail with the patient.   Additional concerns today include:   Increased anxiety, difficulty concentrating.  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  No previous diagnosis of ADD/learning disorder.  Anxiety has been a bit worse overall, previously on Effexor which was not helpful, Zoloft caused significant lethargy/fatigue.       Observations/Objective: BP 135/73   Pulse 78   Temp 97.9 F (36.6 C) (Oral)   Wt 174 lb (78.9 kg)   BMI 28.08 kg/m  BP Readings from Last 3 Encounters:  02/15/19 135/73  05/04/18 140/87  11/25/17 (!) 143/82   Exam: Normal Speech.  NAD  Lab and Radiology Results No results found for this or any previous visit (from the past 72 hour(s)). No results found.     Assessment and Plan: 54 y.o. female with The primary encounter diagnosis was Annual physical exam. Diagnoses of Essential hypertension, Anxiety, and Mixed hyperlipidemia were also pertinent to this visit.   PDMP not reviewed this encounter. Orders Placed This Encounter  Procedures  . Lipid panel   Meds ordered this encounter  Medications  . escitalopram (LEXAPRO) 5 MG  tablet    Sig: Take 1 tablet (5 mg total) by mouth daily.    Dispense:  90 tablet    Refill:  0  . hydrochlorothiazide (HYDRODIURIL) 25 MG tablet    Sig: Take 1 tablet (25 mg total) by mouth daily.    Dispense:  90 tablet    Refill:  3    Please cancel 12.5 mg dose, thanks!  Marland Kitchen buPROPion (WELLBUTRIN XL) 300 MG 24 hr tablet    Sig: Take 1 tablet (300 mg total) by mouth daily.    Dispense:  90 tablet    Refill:  3  . valACYclovir (VALTREX) 500 MG tablet    Sig: Take 1 tablet (500 mg total) by mouth daily.    Dispense:  90 tablet    Refill:  3   Patient Instructions  Mental health: Let us try adding lisinopril 5 mg daily, best taken in the evenings.  We will plan to follow-up with virtual visit in 4 to 6 weeks to see how you are doing on this medication, please let me know sooner than that if there are any concerns especially with side effects!   General Preventive Care  Most recent routine screening lipids/other labs: already done!  We will want to follow-up on cholesterol in 6 months or so.  If LDL continues to be significantly elevated might consider cholesterol medication  Everyone should have blood pressure checked once per year.   Tobacco: don't!   Alcohol: responsible moderation  is ok for most adults - if you have concerns about your alcohol intake, please talk to me!   Exercise: as tolerated to reduce risk of cardiovascular disease and diabetes. Strength training will also prevent osteoporosis.   Mental health: if need for mental health care (medicines, counseling, other), or concerns about moods, please let me know!   Sexual health: if need for STD testing, or if concerns with libido/pain problems, please let me know!   Advanced Directive: Living Will and/or Healthcare Power of Attorney recommended for all adults, regardless of age or health.  Vaccines  Flu vaccine: recommended for almost everyone, every fall.   Shingles vaccine: Shingrix recommended after age 58.    Pneumonia vaccines: Prevnar and Pneumovax recommended after age 51  Tetanus booster: Tdap recommended every 10 years. 2029 due  Cancer screenings   Colon cancer screening: recommended for everyone at age 18, will think about Cologuard vs Colonoscopy   Breast cancer screening: mammogram ordered  Cervical cancer screening: Pap due!  Lung cancer screening: not needed for non-smokers  Infection screenings . HIV: recommended screening at least once age 17-65, more often as needed. . Gonorrhea/Chlamydia: screening as needed, though many insurances require testing for anyone on birth control pills. . Hepatitis C: recommended for anyone born 28-1964 . TB: certain at-risk populations, or depending on work requirements and/or travel history Other . Bone Density Test: recommended for women at age 21, men at age 41, sooner depending on risk factors    Return in about 6 months (around 08/15/2019) for Lab visit in 6 months to recheck cholesterol.  Virtual visit in 4 weeks to recheck anxiety.    Instructions sent via MyChart. If MyChart not available, pt was given option for info via personal e-mail w/ no guarantee of protected health info over unsecured e-mail communication, and MyChart sign-up instructions were sent to patient.   Follow Up Instructions: Return in about 6 months (around 08/15/2019) for Lab visit in 6 months to recheck cholesterol.  Virtual visit in 4 weeks to recheck anxiety.    I discussed the assessment and treatment plan with the patient. The patient was provided an opportunity to ask questions and all were answered. The patient agreed with the plan and demonstrated an understanding of the instructions.   The patient was advised to call back or seek an in-person evaluation if any new concerns, if symptoms worsen or if the condition fails to improve as anticipated.  35 minutes of non-face-to-face time was provided during this  encounter.      . . . . . . . . . . . . . Marland Kitchen                   Historical information moved to improve visibility of documentation.  Past Medical History:  Diagnosis Date  . Anxiety 12/09/2016  . Elevated blood pressure reading 12/09/2016  . Elevated vitamin B12 level 12/09/2016  . High cholesterol 12/09/2016  . Left hip pain 12/09/2016  . Postmenopausal 12/09/2016   Past Surgical History:  Procedure Laterality Date  . UTERUS REMOVED  2004   Social History   Tobacco Use  . Smoking status: Never Smoker  . Smokeless tobacco: Never Used  Substance Use Topics  . Alcohol use: Not on file   family history includes Alcohol abuse in her father, maternal grandfather, and mother; Cancer in her father; Depression in her mother; Diabetes in her maternal grandmother; Hyperlipidemia in her father and mother; Hypertension in her father and  mother.  Medications: Current Outpatient Medications  Medication Sig Dispense Refill  . ALPRAZolam (XANAX) 0.5 MG tablet TAKE 1 TABLET (0.5 MG TOTAL) BY MOUTH 2 (TWO) TIMES DAILY AS NEEDED FOR ANXIETY. 30 tablet 0  . buPROPion (WELLBUTRIN XL) 300 MG 24 hr tablet Take 1 tablet (300 mg total) by mouth daily. 90 tablet 3  . cholecalciferol (VITAMIN D) 1000 units tablet Take 1,000 Units by mouth daily.    . hydrochlorothiazide (HYDRODIURIL) 25 MG tablet Take 1 tablet (25 mg total) by mouth daily. 90 tablet 3  . valACYclovir (VALTREX) 500 MG tablet Take 1 tablet (500 mg total) by mouth daily. 90 tablet 3  . escitalopram (LEXAPRO) 5 MG tablet Take 1 tablet (5 mg total) by mouth daily. 90 tablet 0   No current facility-administered medications for this visit.   Allergies  Allergen Reactions  . Ketorolac Tromethamine Nausea And Vomiting

## 2019-02-15 NOTE — Patient Instructions (Addendum)
Mental health: Let us try adding lisinopril 5 mg daily, best taken in the evenings.  We will plan to follow-up with virtual visit in 4 to 6 weeks to see how you are doing on this medication, please let me know sooner than that if there are any concerns especially with side effects!   General Preventive Care  Most recent routine screening lipids/other labs: already done!  We will want to follow-up on cholesterol in 6 months or so.  If LDL continues to be significantly elevated might consider cholesterol medication  Everyone should have blood pressure checked once per year.   Tobacco: don't!   Alcohol: responsible moderation is ok for most adults - if you have concerns about your alcohol intake, please talk to me!   Exercise: as tolerated to reduce risk of cardiovascular disease and diabetes. Strength training will also prevent osteoporosis.   Mental health: if need for mental health care (medicines, counseling, other), or concerns about moods, please let me know!   Sexual health: if need for STD testing, or if concerns with libido/pain problems, please let me know!   Advanced Directive: Living Will and/or Healthcare Power of Attorney recommended for all adults, regardless of age or health.  Vaccines  Flu vaccine: recommended for almost everyone, every fall.   Shingles vaccine: Shingrix recommended after age 32.   Pneumonia vaccines: Prevnar and Pneumovax recommended after age 52  Tetanus booster: Tdap recommended every 10 years. 2029 due  Cancer screenings   Colon cancer screening: recommended for everyone at age 73, will think about Cologuard vs Colonoscopy   Breast cancer screening: mammogram ordered  Cervical cancer screening: Pap due!  Lung cancer screening: not needed for non-smokers  Infection screenings . HIV: recommended screening at least once age 72-65, more often as needed. . Gonorrhea/Chlamydia: screening as needed, though many insurances require testing for anyone  on birth control pills. . Hepatitis C: recommended for anyone born 44-1964 . TB: certain at-risk populations, or depending on work requirements and/or travel history Other . Bone Density Test: recommended for women at age 2, men at age 54, sooner depending on risk factors    Return in about 6 months (around 08/15/2019) for Lab visit in 6 months to recheck cholesterol.  Virtual visit in 4 weeks to recheck anxiety.

## 2019-02-16 ENCOUNTER — Other Ambulatory Visit: Payer: Self-pay | Admitting: Osteopathic Medicine

## 2019-02-16 DIAGNOSIS — I10 Essential (primary) hypertension: Secondary | ICD-10-CM

## 2019-02-24 ENCOUNTER — Other Ambulatory Visit: Payer: Self-pay | Admitting: Osteopathic Medicine

## 2019-04-12 ENCOUNTER — Encounter: Payer: Self-pay | Admitting: Osteopathic Medicine

## 2019-04-23 ENCOUNTER — Other Ambulatory Visit: Payer: Self-pay | Admitting: Family Medicine

## 2019-05-11 ENCOUNTER — Other Ambulatory Visit: Payer: Self-pay | Admitting: Osteopathic Medicine

## 2019-08-14 ENCOUNTER — Other Ambulatory Visit: Payer: Self-pay | Admitting: Osteopathic Medicine

## 2019-09-18 ENCOUNTER — Other Ambulatory Visit: Payer: Self-pay | Admitting: Family Medicine

## 2019-09-18 NOTE — Telephone Encounter (Signed)
Last written 04/23/2019 #30 no refills  Last seen 02/15/2019

## 2019-09-19 MED ORDER — ALPRAZOLAM 0.5 MG PO TABS: 0.5000 mg | ORAL_TABLET | Freq: Two times a day (BID) | ORAL | 0 refills | Status: DC | PRN

## 2019-10-22 ENCOUNTER — Other Ambulatory Visit: Payer: Self-pay

## 2019-10-22 DIAGNOSIS — B001 Herpesviral vesicular dermatitis: Secondary | ICD-10-CM

## 2019-10-22 DIAGNOSIS — F419 Anxiety disorder, unspecified: Secondary | ICD-10-CM

## 2019-10-22 DIAGNOSIS — I10 Essential (primary) hypertension: Secondary | ICD-10-CM

## 2019-10-22 MED ORDER — ESCITALOPRAM OXALATE 5 MG PO TABS: 5.0000 mg | ORAL_TABLET | Freq: Every day | ORAL | 1 refills | Status: DC

## 2019-10-22 MED ORDER — HYDROCHLOROTHIAZIDE 25 MG PO TABS: 25.0000 mg | ORAL_TABLET | Freq: Every day | ORAL | 3 refills | Status: DC

## 2019-10-22 MED ORDER — BUPROPION HCL ER (XL) 300 MG PO TB24: 300.0000 mg | ORAL_TABLET | Freq: Every day | ORAL | 3 refills | Status: DC

## 2019-10-22 MED ORDER — VALACYCLOVIR HCL 500 MG PO TABS: 500.0000 mg | ORAL_TABLET | Freq: Every day | ORAL | 3 refills | Status: DC

## 2019-11-01 ENCOUNTER — Other Ambulatory Visit: Payer: Self-pay

## 2019-11-01 DIAGNOSIS — F419 Anxiety disorder, unspecified: Secondary | ICD-10-CM

## 2019-11-01 MED ORDER — ALPRAZOLAM 0.5 MG PO TABS: 0.5000 mg | ORAL_TABLET | Freq: Two times a day (BID) | ORAL | 0 refills | Status: DC | PRN

## 2019-11-01 NOTE — Telephone Encounter (Signed)
Last refill-09/19/2019 Last Ov-02/15/2019

## 2020-01-23 ENCOUNTER — Other Ambulatory Visit: Payer: Self-pay

## 2020-01-23 DIAGNOSIS — F419 Anxiety disorder, unspecified: Secondary | ICD-10-CM

## 2020-01-23 MED ORDER — ALPRAZOLAM 0.5 MG PO TABS: 0.5000 mg | ORAL_TABLET | Freq: Two times a day (BID) | ORAL | 0 refills | Status: DC | PRN

## 2020-01-23 NOTE — Telephone Encounter (Signed)
Pt called requesting a med refill for alprazolam. Per pt, she would like a 90 d/s sent to [facility] mail order pharmacy. Rx pended.

## 2020-01-24 ENCOUNTER — Other Ambulatory Visit: Payer: Self-pay

## 2020-01-24 DIAGNOSIS — I10 Essential (primary) hypertension: Secondary | ICD-10-CM

## 2020-01-24 MED ORDER — HYDROCHLOROTHIAZIDE 25 MG PO TABS: 25.0000 mg | ORAL_TABLET | Freq: Every day | ORAL | 1 refills | Status: DC

## 2020-01-24 NOTE — Telephone Encounter (Signed)
Task completed. Pt will be updated by her pharmacy of med refill.

## 2020-03-25 ENCOUNTER — Other Ambulatory Visit: Payer: Self-pay | Admitting: Osteopathic Medicine

## 2020-03-25 DIAGNOSIS — F419 Anxiety disorder, unspecified: Secondary | ICD-10-CM

## 2020-03-27 ENCOUNTER — Other Ambulatory Visit: Payer: Self-pay | Admitting: Osteopathic Medicine

## 2020-03-27 DIAGNOSIS — F419 Anxiety disorder, unspecified: Secondary | ICD-10-CM

## 2020-04-14 ENCOUNTER — Other Ambulatory Visit: Payer: Self-pay | Admitting: Osteopathic Medicine

## 2020-04-14 DIAGNOSIS — F419 Anxiety disorder, unspecified: Secondary | ICD-10-CM

## 2020-06-25 ENCOUNTER — Other Ambulatory Visit: Payer: Self-pay | Admitting: Osteopathic Medicine

## 2020-06-25 DIAGNOSIS — F419 Anxiety disorder, unspecified: Secondary | ICD-10-CM

## 2020-07-30 ENCOUNTER — Other Ambulatory Visit: Payer: Self-pay | Admitting: Osteopathic Medicine

## 2020-07-30 DIAGNOSIS — F419 Anxiety disorder, unspecified: Secondary | ICD-10-CM

## 2020-07-30 NOTE — Telephone Encounter (Signed)
Last written 04/14/2020 #30 with no refills

## 2020-07-31 MED ORDER — ALPRAZOLAM 0.5 MG PO TABS: 0.5000 mg | ORAL_TABLET | Freq: Two times a day (BID) | ORAL | 0 refills | Status: AC | PRN

## 2020-09-23 ENCOUNTER — Other Ambulatory Visit: Payer: Self-pay | Admitting: Osteopathic Medicine

## 2020-09-23 DIAGNOSIS — B001 Herpesviral vesicular dermatitis: Secondary | ICD-10-CM

## 2020-09-23 DIAGNOSIS — F419 Anxiety disorder, unspecified: Secondary | ICD-10-CM

## 2020-09-25 ENCOUNTER — Other Ambulatory Visit: Payer: Self-pay | Admitting: Osteopathic Medicine

## 2020-09-25 DIAGNOSIS — I10 Essential (primary) hypertension: Secondary | ICD-10-CM

## 2020-10-01 ENCOUNTER — Encounter: Payer: Self-pay | Admitting: Osteopathic Medicine

## 2020-11-06 ENCOUNTER — Other Ambulatory Visit: Payer: Self-pay | Admitting: Osteopathic Medicine

## 2020-11-06 DIAGNOSIS — F419 Anxiety disorder, unspecified: Secondary | ICD-10-CM

## 2020-12-08 ENCOUNTER — Other Ambulatory Visit: Payer: Self-pay | Admitting: Osteopathic Medicine

## 2020-12-08 DIAGNOSIS — I10 Essential (primary) hypertension: Secondary | ICD-10-CM

## 2021-03-09 ENCOUNTER — Other Ambulatory Visit: Payer: Self-pay | Admitting: Physician Assistant

## 2021-03-09 DIAGNOSIS — I10 Essential (primary) hypertension: Secondary | ICD-10-CM

## 2021-09-18 ENCOUNTER — Other Ambulatory Visit: Payer: Self-pay | Admitting: Osteopathic Medicine

## 2021-09-18 DIAGNOSIS — B001 Herpesviral vesicular dermatitis: Secondary | ICD-10-CM
